# Patient Record
Sex: Male | Born: 1990 | Race: White | Hispanic: No | Marital: Single | Smoking: Current every day smoker
Health system: Southern US, Community
[De-identification: ages and names within clinical notes are randomized; demographics above are authoritative.]

## PROBLEM LIST (undated history)

## (undated) DIAGNOSIS — F3181 Bipolar II disorder: Secondary | ICD-10-CM

## (undated) DIAGNOSIS — F603 Borderline personality disorder: Secondary | ICD-10-CM

## (undated) DIAGNOSIS — T1491XA Suicide attempt, initial encounter: Secondary | ICD-10-CM

---
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
## 2009-10-22 ENCOUNTER — Ambulatory Visit: Payer: Self-pay | Admitting: Family Medicine

## 2012-06-01 ENCOUNTER — Ambulatory Visit: Payer: Self-pay | Admitting: Physician Assistant

## 2012-06-03 ENCOUNTER — Ambulatory Visit: Payer: Self-pay | Admitting: Family Medicine

## 2013-05-30 ENCOUNTER — Ambulatory Visit: Payer: Self-pay | Admitting: Physician Assistant

## 2013-06-22 ENCOUNTER — Ambulatory Visit: Payer: Self-pay | Admitting: Physician Assistant

## 2013-09-13 ENCOUNTER — Ambulatory Visit: Payer: Self-pay | Admitting: Family Medicine

## 2013-09-13 ENCOUNTER — Emergency Department: Payer: Self-pay | Admitting: Emergency Medicine

## 2013-09-13 LAB — URINALYSIS, COMPLETE
BACTERIA: NONE SEEN
BILIRUBIN, UR: NEGATIVE
Blood: NEGATIVE
GLUCOSE, UR: NEGATIVE mg/dL (ref 0–75)
Ketone: NEGATIVE
Leukocyte Esterase: NEGATIVE
Nitrite: NEGATIVE
Ph: 7 (ref 4.5–8.0)
Protein: NEGATIVE
RBC,UR: <1 /HPF (ref 0–5)
Specific Gravity: 1.013 (ref 1.003–1.030)
Squamous Epithelial: NONE SEEN

## 2013-09-13 LAB — BASIC METABOLIC PANEL
Anion Gap: 7 (ref 7–16)
BUN: 9 mg/dL (ref 7–18)
CO2: 26 mmol/L (ref 21–32)
Calcium, Total: 9.4 mg/dL (ref 8.5–10.1)
Chloride: 108 mmol/L — ABNORMAL HIGH (ref 98–107)
Creatinine: 0.79 mg/dL (ref 0.60–1.30)
EGFR (African American): >60
EGFR (Non-African Amer.): >60
GLUCOSE: 100 mg/dL — AB (ref 65–99)
Osmolality: 280 (ref 275–301)
POTASSIUM: 3.5 mmol/L (ref 3.5–5.1)
Sodium: 141 mmol/L (ref 136–145)

## 2013-09-13 LAB — DRUG SCREEN, URINE
Amphetamines, Ur Screen: NEGATIVE (ref ?–1000)
BENZODIAZEPINE, UR SCRN: NEGATIVE (ref ?–200)
Barbiturates, Ur Screen: NEGATIVE (ref ?–200)
CANNABINOID 50 NG, UR ~~LOC~~: POSITIVE (ref ?–50)
Cocaine Metabolite,Ur ~~LOC~~: NEGATIVE (ref ?–300)
MDMA (Ecstasy)Ur Screen: NEGATIVE (ref ?–500)
Methadone, Ur Screen: NEGATIVE (ref ?–300)
OPIATE, UR SCREEN: NEGATIVE (ref ?–300)
PHENCYCLIDINE (PCP) UR S: NEGATIVE (ref ?–25)
TRICYCLIC, UR SCREEN: NEGATIVE (ref ?–1000)

## 2013-09-13 LAB — CBC
HCT: 46.6 % (ref 40.0–52.0)
HGB: 15.7 g/dL (ref 13.0–18.0)
MCH: 30.4 pg (ref 26.0–34.0)
MCHC: 33.8 g/dL (ref 32.0–36.0)
MCV: 90 fL (ref 80–100)
Platelet: 255 10*3/uL (ref 150–440)
RBC: 5.18 10*6/uL (ref 4.40–5.90)
RDW: 12.9 % (ref 11.5–14.5)
WBC: 12.6 10*3/uL — AB (ref 3.8–10.6)

## 2013-09-13 LAB — ETHANOL: Ethanol %: <0.003 % (ref 0.000–0.080)

## 2013-10-17 ENCOUNTER — Ambulatory Visit: Payer: Self-pay | Admitting: Emergency Medicine

## 2014-07-28 ENCOUNTER — Ambulatory Visit: Payer: Self-pay | Admitting: Family Medicine

## 2014-12-17 ENCOUNTER — Ambulatory Visit
Admission: EM | Admit: 2014-12-17 | Discharge: 2014-12-17 | Disposition: A | Discharge status: Home / Self Care | Payer: 59 | Attending: Family Medicine | Admitting: Family Medicine

## 2014-12-17 ENCOUNTER — Encounter: Payer: Self-pay | Admitting: Emergency Medicine

## 2014-12-17 DIAGNOSIS — F3161 Bipolar disorder, current episode mixed, mild: Secondary | ICD-10-CM

## 2014-12-17 HISTORY — DX: Bipolar II disorder: F31.81

## 2014-12-17 HISTORY — DX: Suicide attempt, initial encounter: T14.91XA

## 2014-12-17 HISTORY — DX: Borderline personality disorder: F60.3

## 2014-12-17 MED ORDER — ALBUTEROL SULFATE HFA 108 (90 BASE) MCG/ACT IN AERS: 2.0000 | INHALATION_SPRAY | Freq: Four times a day (QID) | RESPIRATORY_TRACT | Status: DC | PRN

## 2014-12-17 MED ORDER — TRAZODONE HCL 150 MG PO TABS: 150.0000 mg | ORAL_TABLET | Freq: Every day | ORAL | Status: DC

## 2014-12-17 MED ORDER — CITALOPRAM HYDROBROMIDE 40 MG PO TABS: 40.0000 mg | ORAL_TABLET | Freq: Every day | ORAL | Status: AC

## 2014-12-17 MED ORDER — BUPROPION HCL ER (SR) 150 MG PO TB12: 150.0000 mg | ORAL_TABLET | Freq: Three times a day (TID) | ORAL | Status: AC

## 2014-12-17 MED ORDER — QUETIAPINE FUMARATE 100 MG PO TABS: 100.0000 mg | ORAL_TABLET | Freq: Three times a day (TID) | ORAL | Status: DC

## 2014-12-17 MED ORDER — GABAPENTIN 100 MG PO CAPS: 100.0000 mg | ORAL_CAPSULE | Freq: Three times a day (TID) | ORAL | Status: DC

## 2014-12-17 NOTE — ED Notes (Signed)
Patient states that he just got released from jail and has no medications to take.

## 2014-12-17 NOTE — ED Provider Notes (Signed)
CSN: [id number]     Arrival date & time 12/17/14  1542 History   First MD Initiated Contact with Patient 12/17/14 1639     Chief Complaint  Patient presents with  . Medication Refill   (Consider location/radiation/quality/duration/timing/severity/associated sxs/prior Treatment) HPI Comments: 24 yo male with a h/o bipolar disorder presents with mother stating he was just released from jail today after being there for 4 months, however he was given any prescriptions for his chronic medications. Patient has been on psychiatric medications for years and has a psychiatrist. Patient states that while in jail he was seen by a psychiatrist there who made some adjustments on his medications initially, but now has been stable and doing well on the current doses/meds. Patient requesting refill on his medications since he was not given any prescriptions prior to being released from jail today. He states he will call his outpatient psychiatrist on Monday to schedule an appointment ASAP.   The history is provided by the patient.    Past Medical History  Diagnosis Date  . Bipolar 2 disorder   . Borderline personality disorder   . Suicide attempt    History reviewed. No pertinent past surgical history. History reviewed. No pertinent family history. History  Substance Use Topics  . Smoking status: Current Every Day Smoker  . Smokeless tobacco: Never Used  . Alcohol Use: No    Review of Systems  Allergies  Sulfa antibiotics  Home Medications   Prior to Admission medications   Medication Sig Start Date End Date Taking? Authorizing Provider  omeprazole (PRILOSEC) 20 MG capsule Take 20 mg by mouth daily.   Yes Historical Provider, MD  albuterol (PROVENTIL HFA;VENTOLIN HFA) 108 (90 BASE) MCG/ACT inhaler Inhale 2 puffs into the lungs every 6 (six) hours as needed for wheezing or shortness of breath. 12/17/14   Payton Mccallum, MD  buPROPion (WELLBUTRIN SR) 150 MG 12 hr tablet Take 1 tablet (150 mg total)  by mouth 3 (three) times daily. 12/17/14   Payton Mccallum, MD  citalopram (CELEXA) 40 MG tablet Take 1 tablet (40 mg total) by mouth daily. 12/17/14   Payton Mccallum, MD  gabapentin (NEURONTIN) 100 MG capsule Take 1 capsule (100 mg total) by mouth 3 (three) times daily. 12/17/14   Payton Mccallum, MD  QUEtiapine (SEROQUEL) 100 MG tablet Take 1 tablet (100 mg total) by mouth 3 (three) times daily. 12/17/14   Payton Mccallum, MD  traZODone (DESYREL) 150 MG tablet Take 1 tablet (150 mg total) by mouth at bedtime. 12/17/14   Payton Mccallum, MD   BP 137/84 mmHg  Pulse 86  Temp(Src) 97.7 F (36.5 C) (Tympanic)  Resp 16  Ht  (1.626 m)  Wt 200 lb (90.719 kg)  BMI 34.31 kg/m2  SpO2 98% Physical Exam  Constitutional: He is oriented to person, place, and time. He appears well-developed and well-nourished. No distress.  Eyes: Conjunctivae are normal. Pupils are equal, round, and reactive to light.  Neck: Normal range of motion. Neck supple. No thyromegaly present.  Cardiovascular: Normal rate, regular rhythm and normal heart sounds.   Pulmonary/Chest: Effort normal and breath sounds normal. No respiratory distress. He has no wheezes. He has no rales.  Lymphadenopathy:    He has no cervical adenopathy.  Neurological: He is alert and oriented to person, place, and time.  Skin: He is not diaphoretic.  Psychiatric: He has a normal mood and affect. His behavior is normal. Thought content normal.  Nursing note and vitals reviewed.   ED  Course  Procedures (including critical care time) Labs Review Labs Reviewed - No data to display  Imaging Review No results found.   MDM   1. Bipolar disorder, current episode mixed, mild    Discharge Medication List as of 12/17/2014  5:05 PM    Plan: 1.  diagnosis reviewed with patient 2. rx as per orders; risks, benefits, potential side effects reviewed with patient; medications as per med list refilled for 10 days only; explained to patient, he needs to see his  psychiatrist ASAP for refills on these chronic medications 3.  F/u prn   Payton Mccallum, MD 12/17/14 2021

## 2015-02-08 ENCOUNTER — Ambulatory Visit: Payer: 59

## 2015-02-08 ENCOUNTER — Encounter: Payer: Self-pay | Admitting: Emergency Medicine

## 2015-02-08 ENCOUNTER — Ambulatory Visit
Admission: EM | Admit: 2015-02-08 | Discharge: 2015-02-08 | Disposition: A | Discharge status: Home / Self Care | Payer: 59 | Attending: Family Medicine | Admitting: Family Medicine

## 2015-02-08 DIAGNOSIS — S62309A Unspecified fracture of unspecified metacarpal bone, initial encounter for closed fracture: Secondary | ICD-10-CM

## 2015-02-08 MED ORDER — HYDROCODONE-ACETAMINOPHEN 5-325 MG PO TABS: 1.0000 | ORAL_TABLET | Freq: Three times a day (TID) | ORAL | Status: DC | PRN

## 2015-02-08 MED ORDER — KETOROLAC TROMETHAMINE 60 MG/2ML IM SOLN
60.0000 mg | Freq: Once | INTRAMUSCULAR | Status: AC
  Administered 2015-02-08: 60 mg via INTRAMUSCULAR

## 2015-02-08 NOTE — ED Notes (Signed)
[facility] called and appointment made for patient to see Dr. Deeann Saint on 02/10/15 at 1:30pm. Disc from Radiology given and instructions given-arrive at 1:15pm and take all insurance information with him. Verbally understands

## 2015-02-08 NOTE — ED Notes (Signed)
Pt with pain and swelling in right hand after a fall x 2 weeks

## 2015-02-08 NOTE — ED Provider Notes (Addendum)
CSN: [id number]     Arrival date & time 02/08/15  1528 History   First MD Initiated Contact with Patient 02/08/15 1544     Chief Complaint  Patient presents with  . Hand Injury   patient states that he fell about 2 weeks ago hit head and hip rock as he fell and he has pain and tenderness in the same area for the fifth metacarpal as if he had a boxer's fracture. He strongly denies hitting anything approaching.    (Consider location/radiation/quality/duration/timing/severity/associated sxs/prior Treatment) Patient is a 24 y.o. male presenting with hand injury. The history is provided by the patient. No language interpreter was used.  Hand Injury Location:  Hand Time since incident:   -2 weeks Injury: yes   Hand location:  R hand Pain details:    Quality:  Aching, sharp, throbbing and tingling   Radiates to:  Does not radiate   Severity:  Severe   Onset quality:  Gradual   Timing:  Constant Chronicity:  New Handedness:  Right-handed Foreign body present:  No foreign bodies Prior injury to area:  No Relieved by:  Nothing Worsened by:  Movement Ineffective treatments:  None tried Associated symptoms: decreased range of motion   Associated symptoms: no neck pain and no numbness   Risk factors: no known bone disorder and no frequent fractures     Past Medical History  Diagnosis Date  . Bipolar 2 disorder   . Borderline personality disorder   . Suicide attempt    History reviewed. No pertinent past surgical history. History reviewed. No pertinent family history. Social History  Substance Use Topics  . Smoking status: Current Every Day Smoker  . Smokeless tobacco: Never Used  . Alcohol Use: Yes    Review of Systems  Musculoskeletal: Negative for neck pain.  Psychiatric/Behavioral:       History of bipolar disease, schizophrenia mouth according to the patient, and history of suicidal ideation area    Allergies  Sulfa antibiotics and Haldol  Home Medications   Prior  to Admission medications   Medication Sig Start Date End Date Taking? Authorizing Provider  albuterol (PROVENTIL HFA;VENTOLIN HFA) 108 (90 BASE) MCG/ACT inhaler Inhale 2 puffs into the lungs every 6 (six) hours as needed for wheezing or shortness of breath. 12/17/14   Payton Mccallum, MD  buPROPion (WELLBUTRIN SR) 150 MG 12 hr tablet Take 1 tablet (150 mg total) by mouth 3 (three) times daily. 12/17/14   Payton Mccallum, MD  citalopram (CELEXA) 40 MG tablet Take 1 tablet (40 mg total) by mouth daily. 12/17/14   Payton Mccallum, MD  gabapentin (NEURONTIN) 100 MG capsule Take 1 capsule (100 mg total) by mouth 3 (three) times daily. 12/17/14   Payton Mccallum, MD  HYDROcodone-acetaminophen (NORCO) 5-325 MG tablet Take 1 tablet by mouth every 8 (eight) hours as needed for moderate pain. 02/08/15   Hassan Rowan, MD  omeprazole (PRILOSEC) 20 MG capsule Take 20 mg by mouth daily.    Historical Provider, MD  QUEtiapine (SEROQUEL) 100 MG tablet Take 1 tablet (100 mg total) by mouth 3 (three) times daily. 12/17/14   Payton Mccallum, MD  traZODone (DESYREL) 150 MG tablet Take 1 tablet (150 mg total) by mouth at bedtime. 12/17/14   Payton Mccallum, MD   Meds Ordered and Administered this Visit   Medications  ketorolac (TORADOL) injection 60 mg (60 mg Intramuscular Given 02/08/15 1648)    BP 134/76 mmHg  Pulse 78  Temp(Src) 97.8 F (36.6 C) (Tympanic)  Resp 18  Ht 5' 3.5" (1.613 m)  Wt 187 lb (84.823 kg)  BMI 32.60 kg/m2  SpO2 99% No data found.   Physical Exam  Constitutional: He is oriented to person, place, and time. He appears well-developed and well-nourished.  HENT:  Head: Normocephalic and atraumatic.  Eyes: Conjunctivae are normal. Pupils are equal, round, and reactive to light.  Musculoskeletal:       Right hand: He exhibits decreased range of motion, tenderness, bony tenderness and swelling.       Hands: Neurological: He is alert and oriented to person, place, and time.  Skin: Skin is warm and dry.    Psychiatric: His mood appears anxious. His speech is rapid and/or pressured.    ED Course  Procedures (including critical care time)  Labs Review Labs Reviewed - No data to display  Imaging Review No results found.   Visual Acuity Review  Right Eye Distance:   Left Eye Distance:   Bilateral Distance:    Right Eye Near:   Left Eye Near:    Bilateral Near:         MDM   1. Closed fracture of multiple sites of metacarpal of right hand, initial encounter   Fracture the fourth and fifth metacarpal carpal bone with impairment of movement of the fourth and fifth fingers. We'll place an splint and referred to orthopedic. He will be seen Thursday by [facility] orthopedic hand surgeon. Because the amount of pain he is having a prescription of Vicodin also given to him as well.  Before leaving patient was given an injection of Toradol as well. Hassan Rowan, MD 02/08/15 1704  Hassan Rowan, MD 03/25/15 [phone]

## 2015-02-08 NOTE — Discharge Instructions (Signed)
Cast or Splint Care °Casts and splints support injured limbs and keep bones from moving while they heal. It is important to care for your cast or splint at home.   °HOME CARE INSTRUCTIONS °· Keep the cast or splint uncovered during the drying period. It can take 24 to 48 hours to dry if it is made of plaster. A fiberglass cast will dry in less than 1 hour. °· Do not rest the cast on anything harder than a pillow for the first 24 hours. °· Do not put weight on your injured limb or apply pressure to the cast until your health care provider gives you permission. °· Keep the cast or splint dry. Wet casts or splints can lose their shape and may not support the limb as well. A wet cast that has lost its shape can also create harmful pressure on your skin when it dries. Also, wet skin can become infected. °¨ Cover the cast or splint with a plastic bag when bathing or when out in the rain or snow. If the cast is on the trunk of the body, take sponge baths until the cast is removed. °¨ If your cast does become wet, dry it with a towel or a blow dryer on the cool setting only. °· Keep your cast or splint clean. Soiled casts may be wiped with a moistened cloth. °· Do not place any hard or soft foreign objects under your cast or splint, such as cotton, toilet paper, lotion, or powder. °· Do not try to scratch the skin under the cast with any object. The object could get stuck inside the cast. Also, scratching could lead to an infection. If itching is a problem, use a blow dryer on a cool setting to relieve discomfort. °· Do not trim or cut your cast or remove padding from inside of it. °· Exercise all joints next to the injury that are not immobilized by the cast or splint. For example, if you have a long leg cast, exercise the hip joint and toes. If you have an arm cast or splint, exercise the shoulder, elbow, thumb, and fingers. °· Elevate your injured arm or leg on 1 or 2 pillows for the first 1 to 3 days to decrease  swelling and pain. It is best if you can comfortably elevate your cast so it is higher than your heart. °SEEK MEDICAL CARE IF:  °· Your cast or splint cracks. °· Your cast or splint is too tight or too loose. °· You have unbearable itching inside the cast. °· Your cast becomes wet or develops a soft spot or area. °· You have a bad smell coming from inside your cast. °· You get an object stuck under your cast. °· Your skin around the cast becomes red or raw. °· You have new pain or worsening pain after the cast has been applied. °SEEK IMMEDIATE MEDICAL CARE IF:  °· You have fluid leaking through the cast. °· You are unable to move your fingers or toes. °· You have discolored (blue or white), cool, painful, or very swollen fingers or toes beyond the cast. °· You have tingling or numbness around the injured area. °· You have severe pain or pressure under the cast. °· You have any difficulty with your breathing or have shortness of breath. °· You have chest pain. °Document Released: 04/27/2000 Document Revised: 02/18/2013 Document Reviewed: 11/06/2012 °[employer]® Patient Information ©2015 [employer]. This information is not intended to replace advice given to you by your health care   provider. Make sure you discuss any questions you have with your health care provider.   Metacarpal Fractures Fractures of metacarpals are breaks in the bones of the hand. They extend from the knuckles to the wrist. These bones can break in many ways. There are different ways of treating these fractures. HOME CARE  Only exercise as told by your doctor.  Return to activities as told by your doctor.  Go to physical therapy as told by your doctor.  Follow your doctor's advice about driving.  Keep the injured hand raised (elevated) above the level of your heart.  If a plaster, fiberglass, or pre-formed splint was applied:  Wear your splint as told and until you are examined again.  Apply ice on the injury for 15-20 minutes  at a time, 03-04 times a day. Put the ice in a plastic bag. Place a towel between your skin and the bag.  Do not get your splint or cast wet. Protect it during bathing with a plastic bag.  Loosen the elastic bandage around the splint if your fingers start to get numb, tingle, get cold, or turn blue.  If the splint is plaster, do not lean it on hard surfaces or put pressure on it for 24 hours after it is put on.  Do not  try to scratch the skin under the cast.  Check the skin around the cast every day. You may put lotion on red or sore areas.  Move the fingers of your casted hand several times a day.  Only take medicine as told by your doctor.  Follow up as told by your doctor. This is very important in order to avoid permanent injury, disability, or lasting (chronic) pain. GET HELP RIGHT AWAY IF:   You develop a rash.  You have problems breathing.  You have any allergy problems.  You have more than a small spot of blood from beneath your cast or splint.  You have redness, puffiness (swelling), or more pain from beneath your cast or splint.  Yellowish-white fluid (pus) comes from beneath your cast or splint.  You develop a temperature by mouth above 102 F (38.9 C), not controlled by medicine.  You have a bad smell coming from under your cast or splint.  You have problems moving any of your fingers. If you do not have a window in your cast for looking at the wound, a fluid or a little bleeding may show up as a stain on the outside of your cast. Tell your doctor about any stains you see. MAKE SURE YOU:   Understand these instructions.  Will watch your condition.  Will get help right away if you are not doing well or get worse. Document Released: 10/17/2007 Document Revised: 09/14/2013 Document Reviewed: 04/05/2009 [facility] Patient Information 2015 [place], [place]. This information is not intended to replace advice given to you by your health care provider. Make sure you  discuss any questions you have with your health care provider.

## 2015-06-06 ENCOUNTER — Ambulatory Visit
Admission: EM | Admit: 2015-06-06 | Discharge: 2015-06-06 | Disposition: A | Discharge status: Home / Self Care | Payer: 59 | Attending: Family Medicine | Admitting: Family Medicine

## 2015-06-06 DIAGNOSIS — L089 Local infection of the skin and subcutaneous tissue, unspecified: Secondary | ICD-10-CM

## 2015-06-06 MED ORDER — MUPIROCIN 2 % EX OINT: TOPICAL_OINTMENT | CUTANEOUS | Status: DC

## 2015-06-06 MED ORDER — AMOXICILLIN-POT CLAVULANATE 875-125 MG PO TABS: 1.0000 | ORAL_TABLET | Freq: Two times a day (BID) | ORAL | Status: DC
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
## 2015-06-06 NOTE — Discharge Instructions (Signed)
Keep clean. Avoid rubbing, scratching or aggravating. Take medication as prescribed.   Follow up closely with your primary care physician this week as needed. Return to Urgent care for new or worsening concerns.

## 2015-06-17 ENCOUNTER — Ambulatory Visit (INDEPENDENT_AMBULATORY_CARE_PROVIDER_SITE_OTHER): Payer: 59

## 2015-06-17 ENCOUNTER — Ambulatory Visit
Admission: EM | Admit: 2015-06-17 | Discharge: 2015-06-17 | Disposition: A | Discharge status: Home / Self Care | Payer: 59 | Attending: Family Medicine | Admitting: Family Medicine

## 2015-06-17 ENCOUNTER — Encounter: Payer: Self-pay | Admitting: *Deleted

## 2015-06-17 DIAGNOSIS — S62318A Displaced fracture of base of other metacarpal bone, initial encounter for closed fracture: Secondary | ICD-10-CM

## 2015-06-17 DIAGNOSIS — S62636A Displaced fracture of distal phalanx of right little finger, initial encounter for closed fracture: Secondary | ICD-10-CM | POA: Diagnosis not present

## 2015-06-17 MED ORDER — HYDROCODONE-ACETAMINOPHEN 5-325 MG PO TABS: 1.0000 | ORAL_TABLET | Freq: Three times a day (TID) | ORAL | Status: DC | PRN
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
## 2015-06-17 NOTE — ED Provider Notes (Signed)
[facility]  ____________________________________________  Time seen: Approximately 6:00 PM  I have reviewed the triage vital signs and the nursing notes.   HISTORY  Chief Complaint Hand Injury   HPI Darin Wood is a 25 y.o. male presents with a complaint of right hand pain. Patient reports that 2 hours prior to arrival he became frustrated and mad and states that he punched a wall out of frustration. Patient reports that right lateral hand pain since that injury. Denies other pain or injury. Denies head injury or loss of consciousness. Denies fall.  Patient states that current right hand pain is 8 out of 10 aching and throbbing. Denies numbness or tingling sensation. Reports he is able to move all fingers and hands well but with pain.   Past Medical History  Diagnosis Date  . Bipolar 2 disorder (HCC)   . Borderline personality disorder   . Suicide attempt (HCC)     There are no active problems to display for this patient.   History reviewed. No pertinent past surgical history.  Current Outpatient Rx  Name  Route  Sig  Dispense  Refill  . albuterol (PROVENTIL HFA;VENTOLIN HFA) 108 (90 BASE) MCG/ACT inhaler   Inhalation   Inhale 2 puffs into the lungs every 6 (six) hours as needed for wheezing or shortness of breath.   8 g   0   .           . clonazePAM (KLONOPIN) 1 MG tablet   Oral   Take 1 mg by mouth 2 (two) times daily.         . divalproex (DEPAKOTE) 500 MG DR tablet   Oral   Take 500 mg by mouth 2 (two) times daily.         Marland Kitchen gabapentin (NEURONTIN) 100 MG capsule   Oral   Take 1 capsule (100 mg total) by mouth 3 (three) times daily.   30 capsule   0   .                      . buPROPion (WELLBUTRIN SR) 150 MG 12 hr tablet   Oral   Take 1 tablet (150 mg total) by mouth 3 (three) times daily.   30 tablet   0   . citalopram (CELEXA) 40 MG tablet   Oral   Take 1 tablet (40 mg total) by mouth daily.   10 tablet   0   .           .            .             Allergies Ceclor; Sulfa antibiotics; Toradol; and Haldol  Family History  Problem Relation Age of Onset  . Diabetes Mother   . Pancreatitis Father     Social History Social History  Substance Use Topics  . Smoking status: Current Every Day Smoker -- 1.00 packs/day    Types: Cigarettes  . Smokeless tobacco: Never Used  . Alcohol Use: Yes     Comment: regularly    Review of Systems Constitutional: No fever/chills Eyes: No visual changes. ENT: No sore throat. Cardiovascular: Denies chest pain. Respiratory: Denies shortness of breath. Gastrointestinal: No abdominal pain.  No nausea, no vomiting.  No diarrhea.  No constipation. Genitourinary: Negative for dysuria. Musculoskeletal: Negative for back pain. Right hand pain.  Skin: Negative for rash. Neurological: Negative for headaches, focal weakness or numbness.  10-point ROS otherwise  negative.  ____________________________________________   PHYSICAL EXAM:  VITAL SIGNS: ED Triage Vitals  Enc Vitals Group     BP 06/17/15 1703 137/78 mmHg     Pulse Rate 06/17/15 1703 81     Resp 06/17/15 1703 18     Temp 06/17/15 1703 98 F (36.7 C)     Temp Source 06/17/15 1703 Oral     SpO2 06/17/15 1703 98 %     Weight 06/17/15 1703 157 lb (71.215 kg)     Height 06/17/15 1703 5\' 4"  (1.626 m)     Head Cir --      Peak Flow --      Pain Score 06/17/15 1708 10     Pain Loc --      Pain Edu? --      Excl. in GC? --     Constitutional: Alert and oriented. Well appearing and in no acute distress. Eyes: Conjunctivae are normal. PERRL. EOMI. Head: Atraumatic.  Nose: No congestion/rhinnorhea.  Mouth/Throat: Mucous membranes are moist.  Cardiovascular: Normal rate, regular rhythm. Grossly normal heart sounds.  Good peripheral circulation. Respiratory: Normal respiratory effort.  No retractions. Lungs CTAB. Gastrointestinal: Soft and nontender. Musculoskeletal: No lower or upper extremity tenderness nor  edema.  No cervical, thoracic or lumbar tenderness to palpation. Except: Right dorsal hand over fourth and fifth distal metacarpals moderate tenderness to palpation with moderate swelling, mild ecchymosis, no erythema, skin intact; Cap refill less than 2 seconds to all distal right hand fingers, pain with right hand grip at distal fifth metacarpal, pain with right hand resisted fourth and fifth digit flexion and extension with full range of motion present and tendon function intact. No motor, tendon or sensation deficit. Right upper extremity otherwise nontender. Neurologic:  Normal speech and language. No gross focal neurologic deficits are appreciated. No gait instability. Skin:  Skin is warm, dry and intact. No rash noted. Psychiatric: Mood and affect are normal. Speech and behavior are normal.  ____________________________________________   LABS (all labs ordered are listed, but only abnormal results are displayed)  Labs Reviewed - No data to display  RADIOLOGY  EXAM: RIGHT HAND - COMPLETE 3+ VIEW  COMPARISON: February 08, 2015.  FINDINGS: There appears to be mildly displaced acute fracture superimposed upon deformity of distal fifth metacarpal from previous old fracture. Overlying soft tissue swelling is noted. No radiopaque foreign body is noted. No other bony abnormality is noted.  IMPRESSION: Mildly displaced acute fracture involving distal fifth metacarpal in the area of deformity from previous old healed fracture.   Electronically Signed By: Lupita Raider, M.D. On: 06/17/2015 17:35  I, Renford Dills, personally viewed and evaluated these images (plain radiographs) as part of my medical decision making, as well as reviewing the written report by the radiologist.  ____________________________________________   PROCEDURES  Procedure(s) performed:  Right ocl ulnar gutter splint applied by RN. Patient states has sling at home. Neurovascular intact post  application.  ____________________________________________   INITIAL IMPRESSION / ASSESSMENT AND PLAN / ED COURSE  Pertinent labs & imaging results that were available during my care of the patient were reviewed by me and considered in my medical decision making (see chart for details).  Very well-appearing patient. No acute distress. Presents for the complaint of right lateral hand pain post injury. Reports that he became mad earlier and punched a wall. Reports he is right-hand dominant. Denies other injury. Patient reports history of similar this past year. Right hand x-ray reviewed, mildly displaced acute fracture involving  distal fifth metacarpal in the area of the deformity from previous old health fracture per radiologist. Patient placed in right OCL ulnar gutter splint. Patient directed apply ice and elevate. Directed to follow-up with orthopedist.. over-the-counter Tylenol or ibuprofen as needed, quantity #10 Norco given.  Discussed follow up with Primary care physician this week. Discussed follow up and return parameters including no resolution or any worsening concerns. Patient verbalized understanding and agreed to plan.   ____________________________________________   FINAL CLINICAL IMPRESSION(S) / ED DIAGNOSES  Final diagnoses:  Fracture of fifth metacarpal bone, closed, initial encounter      Note: This dictation was prepared with Dragon dictation along with smaller phrase technology. Any transcriptional errors that result from this process are unintentional.    Renford Dills, NP 06/17/15 2135

## 2015-06-17 NOTE — Discharge Instructions (Signed)
Keep in splint. Elevate and apply ice.  Take medication as prescribed. Rest. Drink plenty of fluids.   Follow up with your primary care physician this week as needed. Return to Urgent care for new or worsening concerns.    Metacarpal Fracture A metacarpal fracture is a break (fracture) of a bone in the hand. Metacarpals are the bones that extend from your knuckles to your wrist. In each hand, you have five metacarpal bones that connect your fingers and your thumb to your wrist. Some hand fractures have bone pieces that are close together and stable (simple). These fractures may be treated with only a splint or cast. Hand fractures that have many pieces of broken bone (comminuted), unstable bone pieces (displaced), or a bone that breaks through the skin (compound) usually require surgery. CAUSES This injury may be caused by:  A fall.  A hard, direct hit to your hand.  An injury that squeezes your knuckle, stretches your finger out of place, or crushes your hand. RISK FACTORS This injury is more likely to occur if:  You play contact sports.  You have certain bone diseases. SYMPTOMS  Symptoms of this type of fracture develop soon after the injury. Symptoms may include:  Swelling.  Pain.  Stiffness.  Increased pain with movement.  Bruising.  Inability to move a finger.  A shortened finger.  A finger knuckle that looks sunken in.  Unusual appearance of the hand or finger (deformity). DIAGNOSIS  This injury may be diagnosed based on your signs and symptoms, especially if you had a recent hand injury. Your health care provider will perform a physical exam. He or she may also order X-rays to confirm the diagnosis.  TREATMENT  Treatment for this injury depends on the type of fracture you have and how severe it is. Possible treatments include:  Non-reduction. This can be done if the bone does not need to be moved back into place. The fracture can be casted or splinted as it is.    Closed reduction. If your bone is stable and can be moved back into place, you may only need to wear a cast or splint or have buddy taping.  Closed reduction with internal fixation (CRIF). This is the most common treatment. You may have this procedure if your bone can be moved back into place but needs more support. Wires, pins, or screws may be inserted through your skin to stabilize the fracture.  Open reduction with internal fixation (ORIF). This may be needed if your fracture is severe and unstable. It involves surgery to move your bone back into the right position. Screws, wires, or plates are used to stabilize the fracture. After all procedures, you may need to wear a cast or a splint for several weeks. You will also need to have follow-up X-rays to make sure that the bone is healing well and staying in position. After you no longer need your cast or splint, you may need physical therapy. This will help you to regain full movement and strength in your hand.  HOME CARE INSTRUCTIONS  If You Have a Cast:  Do not stick anything inside the cast to scratch your skin. Doing that increases your risk of infection.  Check the skin around the cast every day. Report any concerns to your health care provider. You may put lotion on dry skin around the edges of the cast. Do not apply lotion to the skin underneath the cast. If You Have a Splint:  Wear it as directed  by your health care provider. Remove it only as directed by your health care provider.  Loosen the splint if your fingers become numb and tingle, or if they turn cold and blue. Bathing  Cover the cast or splint with a watertight plastic bag to protect it from water while you take a bath or a shower. Do not let the cast or splint get wet. Managing Pain, Stiffness, and Swelling  If directed, apply ice to the injured area (if you have a splint, not a cast):  Put ice in a plastic bag.  Place a towel between your skin and the  bag.  Leave the ice on for 20 minutes, 2-3 times a day.  Move your fingers often to avoid stiffness and to lessen swelling.  Raise the injured area above the level of your heart while you are sitting or lying down. Driving  Do not drive or operate heavy machinery while taking pain medicine.  Do not drive while wearing a cast or splint on a hand that you use for driving. Activity  Return to your normal activities as directed by your health care provider. Ask your health care provider what activities are safe for you. General Instructions  Do not put pressure on any part of the cast or splint until it is fully hardened. This may take several hours.  Keep the cast or splint clean and dry.  Do not use any tobacco products, including cigarettes, chewing tobacco, or electronic cigarettes. Tobacco can delay bone healing. If you need help quitting, ask your health care provider.  Take medicines only as directed by your health care provider.  Keep all follow-up visits as directed by your health care provider. This is important. SEEK MEDICAL CARE IF:   Your pain is getting worse.  You have redness, swelling, or pain in the injured area.   You have fluid, blood, or pus coming from under your cast or splint.   You notice a bad smell coming from under your cast or splint.   You have a fever.  SEEK IMMEDIATE MEDICAL CARE IF:   You develop a rash.   You have trouble breathing.   Your skin or nails on your injured hand turn blue or gray even after you loosen your splint.  Your injured hand feels cold or becomes numb even after you loosen your splint.   You develop severe pain under the cast or in your hand.   This information is not intended to replace advice given to you by your health care provider. Make sure you discuss any questions you have with your health care provider.   Document Released: 04/30/2005 Document Revised: 01/19/2015 Document Reviewed:  02/17/2014 Elsevier Interactive Patient Education [employer]

## 2015-06-21 ENCOUNTER — Ambulatory Visit: Payer: 59

## 2015-06-21 ENCOUNTER — Ambulatory Visit
Admission: EM | Admit: 2015-06-21 | Discharge: 2015-06-21 | Disposition: A | Discharge status: Home / Self Care | Payer: 59 | Attending: Family Medicine | Admitting: Family Medicine

## 2015-06-21 DIAGNOSIS — S0990XA Unspecified injury of head, initial encounter: Secondary | ICD-10-CM

## 2015-06-21 DIAGNOSIS — M542 Cervicalgia: Secondary | ICD-10-CM

## 2015-06-21 DIAGNOSIS — M25519 Pain in unspecified shoulder: Secondary | ICD-10-CM | POA: Diagnosis not present

## 2015-06-21 NOTE — ED Notes (Signed)
Attempted to document triage assessment and chart instructed this nurse to "break the glass". Chart opened and documentation done
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
## 2015-06-23 ENCOUNTER — Encounter: Payer: Self-pay | Admitting: *Deleted

## 2015-06-23 ENCOUNTER — Emergency Department
Admission: EM | Admit: 2015-06-23 | Discharge: 2015-06-23 | Disposition: A | Discharge status: Home / Self Care | Payer: 59 | Attending: Emergency Medicine | Admitting: Emergency Medicine

## 2015-06-23 DIAGNOSIS — Y92410 Unspecified street and highway as the place of occurrence of the external cause: Secondary | ICD-10-CM | POA: Insufficient documentation

## 2015-06-23 DIAGNOSIS — S161XXA Strain of muscle, fascia and tendon at neck level, initial encounter: Secondary | ICD-10-CM | POA: Insufficient documentation

## 2015-06-23 DIAGNOSIS — Y9389 Activity, other specified: Secondary | ICD-10-CM | POA: Insufficient documentation

## 2015-06-23 DIAGNOSIS — S199XXA Unspecified injury of neck, initial encounter: Secondary | ICD-10-CM | POA: Diagnosis present

## 2015-06-23 DIAGNOSIS — S39012A Strain of muscle, fascia and tendon of lower back, initial encounter: Secondary | ICD-10-CM | POA: Insufficient documentation

## 2015-06-23 DIAGNOSIS — F17210 Nicotine dependence, cigarettes, uncomplicated: Secondary | ICD-10-CM | POA: Diagnosis not present

## 2015-06-23 DIAGNOSIS — Y998 Other external cause status: Secondary | ICD-10-CM | POA: Insufficient documentation

## 2015-06-23 DIAGNOSIS — Z792 Long term (current) use of antibiotics: Secondary | ICD-10-CM | POA: Diagnosis not present

## 2015-06-23 DIAGNOSIS — Z79899 Other long term (current) drug therapy: Secondary | ICD-10-CM | POA: Diagnosis not present

## 2015-06-23 DIAGNOSIS — F3181 Bipolar II disorder: Secondary | ICD-10-CM | POA: Insufficient documentation

## 2015-06-23 MED ORDER — METHOCARBAMOL 500 MG PO TABS
1000.0000 mg | ORAL_TABLET | Freq: Once | ORAL | Status: AC
  Administered 2015-06-23: 1000 mg via ORAL
  Filled 2015-06-23: qty 2

## 2015-06-23 MED ORDER — METHOCARBAMOL 750 MG PO TABS: 1500.0000 mg | ORAL_TABLET | Freq: Four times a day (QID) | ORAL | Status: DC

## 2015-06-23 MED ORDER — OXYCODONE-ACETAMINOPHEN 7.5-325 MG PO TABS: 1.0000 | ORAL_TABLET | ORAL | Status: DC | PRN

## 2015-06-23 MED ORDER — OXYCODONE-ACETAMINOPHEN 5-325 MG PO TABS
1.0000 | ORAL_TABLET | Freq: Once | ORAL | Status: AC
  Administered 2015-06-23: 1 via ORAL
  Filled 2015-06-23: qty 1

## 2015-06-23 MED ORDER — IBUPROFEN 600 MG PO TABS: 600.0000 mg | ORAL_TABLET | Freq: Four times a day (QID) | ORAL | Status: DC | PRN

## 2015-06-23 MED ORDER — IBUPROFEN 800 MG PO TABS
800.0000 mg | ORAL_TABLET | Freq: Once | ORAL | Status: AC
  Administered 2015-06-23: 800 mg via ORAL
  Filled 2015-06-23: qty 1
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
## 2015-06-23 NOTE — Discharge Instructions (Signed)
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
## 2015-09-26 ENCOUNTER — Other Ambulatory Visit: Payer: Self-pay | Admitting: *Deleted

## 2015-09-26 NOTE — Patient Outreach (Signed)
Unsuccessful first attempt to reach member to screen for care management needs related to high cost referral for numerous ED visits for various complaints. Unable to leave message on contact number due to voice mail box being full. Will attempt another outreach at a later date. Bary RichardJanet S. Tyriana Helmkamp RN,CCM,CDE [facility] Care Management Coordinator [facility] Office Phone [phone] Office Fax [phone]

## 2015-10-12 ENCOUNTER — Other Ambulatory Visit: Payer: Self-pay | Admitting: *Deleted

## 2015-10-12 ENCOUNTER — Encounter: Payer: Self-pay | Admitting: *Deleted

## 2015-10-12 NOTE — Patient Outreach (Signed)
Unsuccessful second attempt to reach member to screen for care management needs related to high cost referral for numerous ED visits for various complaints. Recording on contact number states number has been disconnected or no longer in service so will mail unsuccessful outreach letter to member and include [facility]. Bary RichardJanet S. Eliab Closson RN,CCM,CDE [facility] Care Management Coordinator [facility] Office Phone [phone] Office Fax [phone]

## 2016-01-05 DIAGNOSIS — F3177 Bipolar disorder, in partial remission, most recent episode mixed: Secondary | ICD-10-CM | POA: Diagnosis not present

## 2016-01-05 DIAGNOSIS — F410 Panic disorder [episodic paroxysmal anxiety] without agoraphobia: Secondary | ICD-10-CM | POA: Diagnosis not present

## 2016-01-05 DIAGNOSIS — F1120 Opioid dependence, uncomplicated: Secondary | ICD-10-CM | POA: Diagnosis not present

## 2016-01-05 DIAGNOSIS — Z79899 Other long term (current) drug therapy: Secondary | ICD-10-CM | POA: Diagnosis not present

## 2016-01-06 ENCOUNTER — Other Ambulatory Visit
Admission: RE | Admit: 2016-01-06 | Discharge: 2016-01-06 | Disposition: A | Discharge status: Home / Self Care | Payer: 59 | Source: Ambulatory Visit | Attending: Psychiatry | Admitting: Psychiatry

## 2016-01-06 DIAGNOSIS — F319 Bipolar disorder, unspecified: Secondary | ICD-10-CM | POA: Insufficient documentation

## 2016-01-06 DIAGNOSIS — Z79899 Other long term (current) drug therapy: Secondary | ICD-10-CM | POA: Diagnosis not present

## 2016-01-06 LAB — HEPATIC FUNCTION PANEL
ALT: 37 U/L (ref 17–63)
AST: 27 U/L (ref 15–41)
Albumin: 5 g/dL (ref 3.5–5.0)
Alkaline Phosphatase: 95 U/L (ref 38–126)
Total Bilirubin: 0.7 mg/dL (ref 0.3–1.2)
Total Protein: 7.5 g/dL (ref 6.5–8.1)

## 2016-01-06 LAB — VALPROIC ACID LEVEL

## 2016-01-10 ENCOUNTER — Emergency Department
Admission: EM | Admit: 2016-01-10 | Discharge: 2016-01-10 | Disposition: A | Discharge status: Home / Self Care | Payer: 59 | Attending: Emergency Medicine | Admitting: Emergency Medicine

## 2016-01-10 ENCOUNTER — Encounter: Payer: Self-pay | Admitting: *Deleted

## 2016-01-10 ENCOUNTER — Encounter: Payer: Self-pay | Admitting: Physician Assistant

## 2016-01-10 ENCOUNTER — Ambulatory Visit (INDEPENDENT_AMBULATORY_CARE_PROVIDER_SITE_OTHER)
Admission: EM | Admit: 2016-01-10 | Discharge: 2016-01-10 | Disposition: A | Discharge status: Home / Self Care | Payer: 59 | Source: Home / Self Care | Attending: Family Medicine | Admitting: Family Medicine

## 2016-01-10 DIAGNOSIS — R339 Retention of urine, unspecified: Secondary | ICD-10-CM | POA: Diagnosis not present

## 2016-01-10 DIAGNOSIS — F1123 Opioid dependence with withdrawal: Secondary | ICD-10-CM | POA: Diagnosis not present

## 2016-01-10 DIAGNOSIS — F1193 Opioid use, unspecified with withdrawal: Secondary | ICD-10-CM

## 2016-01-10 DIAGNOSIS — Z79899 Other long term (current) drug therapy: Secondary | ICD-10-CM | POA: Insufficient documentation

## 2016-01-10 DIAGNOSIS — R338 Other retention of urine: Secondary | ICD-10-CM | POA: Diagnosis not present

## 2016-01-10 DIAGNOSIS — F17210 Nicotine dependence, cigarettes, uncomplicated: Secondary | ICD-10-CM | POA: Diagnosis not present

## 2016-01-10 DIAGNOSIS — Z792 Long term (current) use of antibiotics: Secondary | ICD-10-CM | POA: Diagnosis not present

## 2016-01-10 LAB — URINALYSIS COMPLETE WITH MICROSCOPIC (ARMC ONLY)
BACTERIA UA: NONE SEEN
Bilirubin Urine: NEGATIVE
Glucose, UA: NEGATIVE mg/dL
Hgb urine dipstick: NEGATIVE
Ketones, ur: NEGATIVE mg/dL
Leukocytes, UA: NEGATIVE
Nitrite: NEGATIVE
PROTEIN: NEGATIVE mg/dL
SQUAMOUS EPITHELIAL / LPF: NONE SEEN
Specific Gravity, Urine: 1.016 (ref 1.005–1.030)
WBC, UA: NONE SEEN WBC/hpf (ref 0–5)
pH: 6 (ref 5.0–8.0)

## 2016-01-10 MED ORDER — TAMSULOSIN HCL 0.4 MG PO CAPS
0.4000 mg | ORAL_CAPSULE | Freq: Once | ORAL | Status: AC
  Administered 2016-01-10: 0.4 mg via ORAL
  Filled 2016-01-10: qty 1

## 2016-01-10 MED ORDER — ACETAMINOPHEN 325 MG PO TABS
650.0000 mg | ORAL_TABLET | Freq: Once | ORAL | Status: AC
  Administered 2016-01-10: 650 mg via ORAL
  Filled 2016-01-10: qty 2

## 2016-01-10 MED ORDER — TAMSULOSIN HCL 0.4 MG PO CAPS: 0.4000 mg | ORAL_CAPSULE | Freq: Every day | ORAL | 0 refills | Status: AC

## 2016-01-10 NOTE — ED Notes (Signed)
I&O output= 

## 2016-01-10 NOTE — Discharge Instructions (Signed)
Take the prescription med as directed. Follow-up with your psychiatrist for further opioid management. Return to the ED for any continued urinary retention.

## 2016-01-10 NOTE — ED Notes (Signed)
Pt denies any psych complaints at this time denies any SI or HI.

## 2016-01-10 NOTE — ED Notes (Signed)
Report given to [facility] ED charge nurse Nedra HaiLee, RN.

## 2016-01-10 NOTE — ED Notes (Signed)
Bladder scanner shows 

## 2016-01-10 NOTE — ED Provider Notes (Signed)
[facility]    CSN: [id number] Arrival date & time: 01/10/16  0102  First Provider Contact:  First MD Initiated Contact with Patient 01/10/16 1915        History   Chief Complaint Chief Complaint  Patient presents with  . Urinary Retention    HPI Darin Wood is a 25 y.o. male.   Patient is a 25 year old white male been on street drugs. He states his been on Suboxone on the street and he states he finally came clean to his psychiatrist who started him on Suboxone. Even though he states he has bought Suboxone on street he states didn't know the people by on street would be that ear that his friend at the get the prescription filled on the 24th of August. solel it from him yesterday. He now feels that he is going through withdrawals from this do not have an Suboxone. Also he states he is having urinary retention and he is unable to void and states that he has  been unable to void now for about 24 hours. He is curious whether this is a side effect of the Suboxone. He's having abdominal tenderness course because he has not been able to urinate in 24 hours. Should be noted that he was also smoking when initially came to the room. He has a history of bipolar disease borderline personality disorder and previous suicide attempts. No previous surgical history. Mother has diabetes and father has history of pancreatitis and of course he smokes.   The history is provided by the patient and a significant other. No language interpreter was used.    Past Medical History:  Diagnosis Date  . Bipolar 2 disorder (HCC)   . Borderline personality disorder   . Suicide attempt (HCC)     There are no active problems to display for this patient.   History reviewed. No pertinent surgical history.     Home Medications    Prior to Admission medications   Medication Sig Start Date End Date Taking? Authorizing Provider  buPROPion (WELLBUTRIN SR) 150 MG 12 hr tablet Take 1 tablet (150 mg  total) by mouth 3 (three) times daily. 12/17/14  Yes Payton Mccallum, MD  clonazePAM (KLONOPIN) 1 MG tablet Take 1 mg by mouth 2 (two) times daily.   Yes Historical Provider, MD  omeprazole (PRILOSEC) 20 MG capsule Take 20 mg by mouth daily.   Yes Historical Provider, MD  albuterol (PROVENTIL HFA;VENTOLIN HFA) 108 (90 BASE) MCG/ACT inhaler Inhale 2 puffs into the lungs every 6 (six) hours as needed for wheezing or shortness of breath. 12/17/14   Payton Mccallum, MD  amoxicillin-clavulanate (AUGMENTIN) 875-125 MG tablet Take 1 tablet by mouth every 12 (twelve) hours. 06/06/15   Renford Dills, NP  citalopram (CELEXA) 40 MG tablet Take 1 tablet (40 mg total) by mouth daily. 12/17/14   Payton Mccallum, MD  divalproex (DEPAKOTE) 500 MG DR tablet Take 500 mg by mouth 2 (two) times daily.    Historical Provider, MD  gabapentin (NEURONTIN) 100 MG capsule Take 1 capsule (100 mg total) by mouth 3 (three) times daily. 12/17/14   Payton Mccallum, MD  HYDROcodone-acetaminophen (NORCO/VICODIN) 5-325 MG tablet Take 1 tablet by mouth every 8 (eight) hours as needed for moderate pain or severe pain (do not drive or operate machinery while taking as can cause drowsiness). 06/17/15   Renford Dills, NP  ibuprofen (ADVIL,MOTRIN) 600 MG tablet Take 1 tablet (600 mg total) by mouth every 6 (six) hours as needed.  06/23/15   Joni Reiningonald K Smith, PA-C  methocarbamol (ROBAXIN-750) 750 MG tablet Take 2 tablets (1,500 mg total) by mouth 4 (four) times daily. 06/23/15   Joni Reiningonald K Smith, PA-C  mupirocin ointment (BACTROBAN) 2 % Apply three times a day for 5 days. 06/06/15   Renford DillsLindsey Miller, NP  oxyCODONE-acetaminophen (PERCOCET) 7.5-325 MG tablet Take 1 tablet by mouth every 4 (four) hours as needed for severe pain. 06/23/15   Joni Reiningonald K Smith, PA-C  QUEtiapine (SEROQUEL) 100 MG tablet Take 1 tablet (100 mg total) by mouth 3 (three) times daily. 12/17/14   Payton Mccallumrlando Conty, MD  traZODone (DESYREL) 150 MG tablet Take 1 tablet (150 mg total) by mouth at bedtime. 12/17/14    Payton Mccallumrlando Conty, MD    Family History Family History  Problem Relation Age of Onset  . Diabetes Mother   . Pancreatitis Father     Social History Social History  Substance Use Topics  . Smoking status: Current Every Day Smoker    Packs/day: 1.00    Types: Cigarettes  . Smokeless tobacco: Never Used  . Alcohol use Yes     Comment: regularly     Allergies   Ceclor [cefaclor]; Sulfa antibiotics; Toradol [ketorolac tromethamine]; Tramadol; and Haldol [haloperidol lactate]   Review of Systems Review of Systems  Constitutional: Negative.   Gastrointestinal: Positive for abdominal distention.  Genitourinary: Positive for decreased urine volume, difficulty urinating and flank pain.  All other systems reviewed and are negative.    Physical Exam Triage Vital Signs ED Triage Vitals  Enc Vitals Group     BP 01/10/16 1859 123/70     Pulse Rate 01/10/16 1859 97     Resp 01/10/16 1859 16     Temp 01/10/16 1859 98.1 F (36.7 C)     Temp Source 01/10/16 1859 Oral     SpO2 01/10/16 1859 94 %     Weight 01/10/16 1902 170 lb (77.1 kg)     Height 01/10/16 1902 5\' 4"  (1.626 m)     Head Circumference --      Peak Flow --      Pain Score 01/10/16 1906 7     Pain Loc --      Pain Edu? --      Excl. in GC? --    No data found.   Updated Vital Signs BP 123/70 (BP Location: Left Arm)   Pulse 97   Temp 98.1 F (36.7 C) (Oral)   Resp 16   Ht 5\' 4"  (1.626 m)   Wt 170 lb (77.1 kg)   SpO2 94%   BMI 29.18 kg/m   Visual Acuity Right Eye Distance:   Left Eye Distance:   Bilateral Distance:    Right Eye Near:   Left Eye Near:    Bilateral Near:     Physical Exam  Constitutional: He is oriented to person, place, and time. He appears well-developed and well-nourished.  HENT:  Head: Normocephalic and atraumatic.  Eyes: Pupils are equal, round, and reactive to light.  Neck: Normal range of motion. Neck supple.  Cardiovascular: Normal rate and regular rhythm.     Pulmonary/Chest: Effort normal. He has rhonchi.  Abdominal: Normal appearance and bowel sounds are normal. There is hepatosplenomegaly. There is tenderness in the suprapubic area. There is no CVA tenderness.    Musculoskeletal: Normal range of motion.  Lymphadenopathy:    He has no cervical adenopathy.  Neurological: He is alert and oriented to person, place, and time.  Skin: Skin  is warm and dry.  Psychiatric: He has a normal mood and affect.  Vitals reviewed.    UC Treatments / Results  Labs (all labs ordered are listed, but only abnormal results are displayed) Labs Reviewed - No data to display  EKG  EKG Interpretation None       Radiology No results found.  Procedures Procedures (including critical care time)  Medications Ordered in UC Medications - No data to display   Initial Impression / Assessment and Plan / UC Course  I have reviewed the triage vital signs and the nursing notes.  Pertinent labs & imaging results that were available during my care of the patient were reviewed by me and considered in my medical decision making (see chart for details).  Clinical Course    Patient questions whether this is comes from his Suboxone withdrawal explained to him that it does not matter Dictation #1 [id number]  [id number] for [place] because he will need to be evaluated and have an  in and out or Foley catheter placed, blood work and possible imaging studies. Irregardless this is a problem that needs to be handled in the ED and not urgent care. He wants to go to [facility] which is fine and our nurse Selena Batten will give report to charge nurse at [facility] ED.  Should be noted his significant other's repeatedly during the visit repeatedly tells him that she told him so  Final Clinical Impressions(s) / UC Diagnoses   Final diagnoses:  Acute urinary retention  Opioid withdrawal [facility])    New Prescriptions Discharge  Medication List as of 01/10/2016  7:48 PM       Note: This dictation was prepared with Dragon dictation along with smaller phrase technology. Any transcriptional errors that result from this process are unintentional.   Hassan Rowan, MD 01/10/16 2002
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
## 2016-01-10 NOTE — ED Triage Notes (Signed)
Pt unable to void in past 24 hours. Relates difficulty voiding with abruptly stopping Suboxone.

## 2016-01-10 NOTE — ED Triage Notes (Signed)
Pt has not voided in over 24 hrs no hx of the same, co abd pain. [facility]

## 2016-01-19 DIAGNOSIS — F3177 Bipolar disorder, in partial remission, most recent episode mixed: Secondary | ICD-10-CM | POA: Diagnosis not present

## 2016-01-19 DIAGNOSIS — F410 Panic disorder [episodic paroxysmal anxiety] without agoraphobia: Secondary | ICD-10-CM | POA: Diagnosis not present

## 2016-01-19 DIAGNOSIS — Z79899 Other long term (current) drug therapy: Secondary | ICD-10-CM | POA: Diagnosis not present

## 2016-01-19 DIAGNOSIS — F1120 Opioid dependence, uncomplicated: Secondary | ICD-10-CM | POA: Diagnosis not present

## 2016-02-09 DIAGNOSIS — F1120 Opioid dependence, uncomplicated: Secondary | ICD-10-CM | POA: Diagnosis not present

## 2016-02-09 DIAGNOSIS — F3177 Bipolar disorder, in partial remission, most recent episode mixed: Secondary | ICD-10-CM | POA: Diagnosis not present

## 2016-02-09 DIAGNOSIS — F410 Panic disorder [episodic paroxysmal anxiety] without agoraphobia: Secondary | ICD-10-CM | POA: Diagnosis not present

## 2016-02-09 DIAGNOSIS — Z79899 Other long term (current) drug therapy: Secondary | ICD-10-CM | POA: Diagnosis not present

## 2016-03-08 DIAGNOSIS — Z79899 Other long term (current) drug therapy: Secondary | ICD-10-CM | POA: Diagnosis not present

## 2016-03-08 DIAGNOSIS — F1120 Opioid dependence, uncomplicated: Secondary | ICD-10-CM | POA: Diagnosis not present

## 2016-03-08 DIAGNOSIS — F3177 Bipolar disorder, in partial remission, most recent episode mixed: Secondary | ICD-10-CM | POA: Diagnosis not present

## 2016-03-08 DIAGNOSIS — F410 Panic disorder [episodic paroxysmal anxiety] without agoraphobia: Secondary | ICD-10-CM | POA: Diagnosis not present

## 2016-03-08 DIAGNOSIS — F29 Unspecified psychosis not due to a substance or known physiological condition: Secondary | ICD-10-CM | POA: Diagnosis not present

## 2016-04-04 DIAGNOSIS — F1120 Opioid dependence, uncomplicated: Secondary | ICD-10-CM | POA: Diagnosis not present

## 2016-04-04 DIAGNOSIS — Z79899 Other long term (current) drug therapy: Secondary | ICD-10-CM | POA: Diagnosis not present

## 2016-04-04 DIAGNOSIS — F3177 Bipolar disorder, in partial remission, most recent episode mixed: Secondary | ICD-10-CM | POA: Diagnosis not present

## 2016-04-04 DIAGNOSIS — F410 Panic disorder [episodic paroxysmal anxiety] without agoraphobia: Secondary | ICD-10-CM | POA: Diagnosis not present

## 2016-04-04 DIAGNOSIS — F29 Unspecified psychosis not due to a substance or known physiological condition: Secondary | ICD-10-CM | POA: Diagnosis not present

## 2016-11-10 DIAGNOSIS — F17210 Nicotine dependence, cigarettes, uncomplicated: Secondary | ICD-10-CM | POA: Diagnosis not present

## 2016-11-10 DIAGNOSIS — F411 Generalized anxiety disorder: Secondary | ICD-10-CM | POA: Diagnosis not present

## 2016-11-10 DIAGNOSIS — Z6829 Body mass index (BMI) 29.0-29.9, adult: Secondary | ICD-10-CM | POA: Diagnosis not present

## 2016-11-10 DIAGNOSIS — Z882 Allergy status to sulfonamides status: Secondary | ICD-10-CM | POA: Diagnosis not present

## 2016-11-10 DIAGNOSIS — F209 Schizophrenia, unspecified: Secondary | ICD-10-CM | POA: Diagnosis not present

## 2016-11-10 DIAGNOSIS — R45851 Suicidal ideations: Secondary | ICD-10-CM | POA: Diagnosis not present

## 2016-11-10 DIAGNOSIS — F1994 Other psychoactive substance use, unspecified with psychoactive substance-induced mood disorder: Secondary | ICD-10-CM | POA: Diagnosis not present

## 2016-11-10 DIAGNOSIS — M25511 Pain in right shoulder: Secondary | ICD-10-CM | POA: Diagnosis not present

## 2016-11-10 DIAGNOSIS — F329 Major depressive disorder, single episode, unspecified: Secondary | ICD-10-CM | POA: Diagnosis not present

## 2016-11-10 DIAGNOSIS — F909 Attention-deficit hyperactivity disorder, unspecified type: Secondary | ICD-10-CM | POA: Diagnosis not present

## 2016-11-10 DIAGNOSIS — F419 Anxiety disorder, unspecified: Secondary | ICD-10-CM | POA: Diagnosis not present

## 2016-11-10 DIAGNOSIS — M25531 Pain in right wrist: Secondary | ICD-10-CM | POA: Diagnosis not present

## 2016-11-11 DIAGNOSIS — F909 Attention-deficit hyperactivity disorder, unspecified type: Secondary | ICD-10-CM | POA: Diagnosis not present

## 2016-11-11 DIAGNOSIS — M25511 Pain in right shoulder: Secondary | ICD-10-CM | POA: Diagnosis not present

## 2016-11-11 DIAGNOSIS — F419 Anxiety disorder, unspecified: Secondary | ICD-10-CM | POA: Diagnosis not present

## 2016-11-11 DIAGNOSIS — F209 Schizophrenia, unspecified: Secondary | ICD-10-CM | POA: Diagnosis not present

## 2016-11-11 DIAGNOSIS — F17210 Nicotine dependence, cigarettes, uncomplicated: Secondary | ICD-10-CM | POA: Diagnosis not present

## 2016-11-11 DIAGNOSIS — F329 Major depressive disorder, single episode, unspecified: Secondary | ICD-10-CM | POA: Diagnosis not present

## 2016-11-11 DIAGNOSIS — Z882 Allergy status to sulfonamides status: Secondary | ICD-10-CM | POA: Diagnosis not present

## 2016-11-11 DIAGNOSIS — M25531 Pain in right wrist: Secondary | ICD-10-CM | POA: Diagnosis not present

## 2016-11-11 DIAGNOSIS — R45851 Suicidal ideations: Secondary | ICD-10-CM | POA: Diagnosis not present

## 2016-11-12 DIAGNOSIS — M25531 Pain in right wrist: Secondary | ICD-10-CM | POA: Diagnosis not present

## 2016-11-12 DIAGNOSIS — F17210 Nicotine dependence, cigarettes, uncomplicated: Secondary | ICD-10-CM | POA: Diagnosis not present

## 2016-11-12 DIAGNOSIS — R45851 Suicidal ideations: Secondary | ICD-10-CM | POA: Diagnosis not present

## 2016-11-12 DIAGNOSIS — F209 Schizophrenia, unspecified: Secondary | ICD-10-CM | POA: Diagnosis not present

## 2016-11-12 DIAGNOSIS — Z882 Allergy status to sulfonamides status: Secondary | ICD-10-CM | POA: Diagnosis not present

## 2016-11-12 DIAGNOSIS — F329 Major depressive disorder, single episode, unspecified: Secondary | ICD-10-CM | POA: Diagnosis not present

## 2016-11-12 DIAGNOSIS — F419 Anxiety disorder, unspecified: Secondary | ICD-10-CM | POA: Diagnosis not present

## 2016-11-12 DIAGNOSIS — F909 Attention-deficit hyperactivity disorder, unspecified type: Secondary | ICD-10-CM | POA: Diagnosis not present

## 2016-11-12 DIAGNOSIS — M25511 Pain in right shoulder: Secondary | ICD-10-CM | POA: Diagnosis not present

## 2017-01-31 ENCOUNTER — Emergency Department
Admission: EM | Admit: 2017-01-31 | Discharge: 2017-02-04 | Disposition: A | Discharge status: Home / Self Care | Payer: Self-pay | Attending: Emergency Medicine | Admitting: Emergency Medicine

## 2017-01-31 DIAGNOSIS — R456 Violent behavior: Secondary | ICD-10-CM | POA: Diagnosis not present

## 2017-01-31 DIAGNOSIS — F99 Mental disorder, not otherwise specified: Secondary | ICD-10-CM | POA: Diagnosis not present

## 2017-01-31 DIAGNOSIS — F319 Bipolar disorder, unspecified: Secondary | ICD-10-CM

## 2017-01-31 DIAGNOSIS — F1994 Other psychoactive substance use, unspecified with psychoactive substance-induced mood disorder: Secondary | ICD-10-CM

## 2017-01-31 DIAGNOSIS — Z91199 Patient's noncompliance with other medical treatment and regimen due to unspecified reason: Secondary | ICD-10-CM

## 2017-01-31 DIAGNOSIS — Z9119 Patient's noncompliance with other medical treatment and regimen: Secondary | ICD-10-CM

## 2017-01-31 DIAGNOSIS — F23 Brief psychotic disorder: Secondary | ICD-10-CM | POA: Diagnosis not present

## 2017-01-31 DIAGNOSIS — F1510 Other stimulant abuse, uncomplicated: Secondary | ICD-10-CM | POA: Insufficient documentation

## 2017-01-31 DIAGNOSIS — F17210 Nicotine dependence, cigarettes, uncomplicated: Secondary | ICD-10-CM | POA: Insufficient documentation

## 2017-01-31 DIAGNOSIS — F15951 Other stimulant use, unspecified with stimulant-induced psychotic disorder with hallucinations: Secondary | ICD-10-CM

## 2017-01-31 DIAGNOSIS — F1210 Cannabis abuse, uncomplicated: Secondary | ICD-10-CM

## 2017-01-31 DIAGNOSIS — Z79899 Other long term (current) drug therapy: Secondary | ICD-10-CM | POA: Insufficient documentation

## 2017-01-31 LAB — CBC
HCT: 48.2 % (ref 40.0–52.0)
Hemoglobin: 17 g/dL (ref 13.0–18.0)
MCH: 30.4 pg (ref 26.0–34.0)
MCHC: 35.3 g/dL (ref 32.0–36.0)
MCV: 86.3 fL (ref 80.0–100.0)
PLATELETS: 367 10*3/uL (ref 150–440)
RBC: 5.58 MIL/uL (ref 4.40–5.90)
RDW: 13.3 % (ref 11.5–14.5)
WBC: 17.2 10*3/uL — ABNORMAL HIGH (ref 3.8–10.6)

## 2017-01-31 MED ORDER — DIPHENHYDRAMINE HCL 50 MG/ML IJ SOLN
50.0000 mg | Freq: Once | INTRAMUSCULAR | Status: AC
  Administered 2017-01-31: 50 mg via INTRAVENOUS

## 2017-01-31 MED ORDER — ZIPRASIDONE MESYLATE 20 MG IM SOLR
20.0000 mg | Freq: Once | INTRAMUSCULAR | Status: AC
  Administered 2017-01-31: 20 mg via INTRAMUSCULAR

## 2017-02-01 ENCOUNTER — Encounter: Payer: Self-pay | Admitting: Emergency Medicine

## 2017-02-01 ENCOUNTER — Emergency Department: Payer: Self-pay

## 2017-02-01 DIAGNOSIS — F209 Schizophrenia, unspecified: Secondary | ICD-10-CM | POA: Insufficient documentation

## 2017-02-01 DIAGNOSIS — F200 Paranoid schizophrenia: Secondary | ICD-10-CM

## 2017-02-01 LAB — COMPREHENSIVE METABOLIC PANEL
ALBUMIN: 5.9 g/dL — AB (ref 3.5–5.0)
ALK PHOS: 121 U/L (ref 38–126)
ALT: 36 U/L (ref 17–63)
ANION GAP: 17 — AB (ref 5–15)
AST: 47 U/L — ABNORMAL HIGH (ref 15–41)
BILIRUBIN TOTAL: 1.5 mg/dL — AB (ref 0.3–1.2)
BUN: 19 mg/dL (ref 6–20)
CALCIUM: 10.1 mg/dL (ref 8.9–10.3)
CHLORIDE: 101 mmol/L (ref 101–111)
CO2: 23 mmol/L (ref 22–32)
Creatinine, Ser: 1.38 mg/dL — ABNORMAL HIGH (ref 0.61–1.24)
GFR calc Af Amer: >60 mL/min (ref >60)
GFR calc non Af Amer: >60 mL/min (ref >60)
GLUCOSE: 135 mg/dL — AB (ref 65–99)
Potassium: 3 mmol/L — ABNORMAL LOW (ref 3.5–5.1)
Sodium: 141 mmol/L (ref 135–145)
Total Protein: 9 g/dL — ABNORMAL HIGH (ref 6.5–8.1)

## 2017-02-01 LAB — URINE DRUG SCREEN, QUALITATIVE (ARMC ONLY)
AMPHETAMINES, UR SCREEN: POSITIVE — AB
BENZODIAZEPINE, UR SCRN: POSITIVE — AB
Barbiturates, Ur Screen: NOT DETECTED
Cannabinoid 50 Ng, Ur ~~LOC~~: POSITIVE — AB
Cocaine Metabolite,Ur ~~LOC~~: NOT DETECTED
MDMA (ECSTASY) UR SCREEN: NOT DETECTED
METHADONE SCREEN, URINE: NOT DETECTED
Opiate, Ur Screen: NOT DETECTED
PHENCYCLIDINE (PCP) UR S: NOT DETECTED
TRICYCLIC, UR SCREEN: NOT DETECTED

## 2017-02-01 LAB — RAPID HIV SCREEN (HIV 1/2 AB+AG)
HIV 1/2 ANTIBODIES: NONREACTIVE
HIV-1 P24 ANTIGEN - HIV24: NONREACTIVE

## 2017-02-01 LAB — ETHANOL: Alcohol, Ethyl (B): <5 mg/dL (ref <5)

## 2017-02-01 LAB — SALICYLATE LEVEL

## 2017-02-01 LAB — ACETAMINOPHEN LEVEL

## 2017-02-01 MED ORDER — ACETAMINOPHEN 500 MG PO TABS
1000.0000 mg | ORAL_TABLET | Freq: Once | ORAL | Status: AC
  Administered 2017-02-01: 1000 mg via ORAL

## 2017-02-01 MED ORDER — DIPHENHYDRAMINE HCL 50 MG/ML IJ SOLN
50.0000 mg | Freq: Once | INTRAMUSCULAR | Status: AC
  Administered 2017-02-01: 50 mg via INTRAMUSCULAR

## 2017-02-01 MED ORDER — ACETAMINOPHEN 500 MG PO TABS
ORAL_TABLET | ORAL | Status: AC
  Administered 2017-02-01: 1000 mg via ORAL
  Filled 2017-02-01: qty 2

## 2017-02-01 MED ORDER — LORAZEPAM 2 MG/ML IJ SOLN
2.0000 mg | Freq: Once | INTRAMUSCULAR | Status: AC
  Administered 2017-02-01: 2 mg via INTRAMUSCULAR

## 2017-02-01 MED ORDER — ZIPRASIDONE MESYLATE 20 MG IM SOLR
INTRAMUSCULAR | Status: AC
  Filled 2017-02-01: qty 20

## 2017-02-01 MED ORDER — ZIPRASIDONE MESYLATE 20 MG IM SOLR
20.0000 mg | Freq: Once | INTRAMUSCULAR | Status: AC
  Administered 2017-02-01: 20 mg via INTRAMUSCULAR

## 2017-02-01 MED ORDER — QUETIAPINE FUMARATE 25 MG PO TABS
100.0000 mg | ORAL_TABLET | Freq: Three times a day (TID) | ORAL | Status: DC
  Administered 2017-02-01 – 2017-02-02 (×2): 100 mg via ORAL
  Filled 2017-02-01 (×2): qty 4

## 2017-02-01 MED ORDER — DIVALPROEX SODIUM 500 MG PO DR TAB
500.0000 mg | DELAYED_RELEASE_TABLET | Freq: Two times a day (BID) | ORAL | Status: DC
  Administered 2017-02-01 – 2017-02-04 (×6): 500 mg via ORAL
  Filled 2017-02-01 (×6): qty 1

## 2017-02-01 NOTE — Consult Note (Signed)
[name]   Reason for Consult:  Consult for 26 year old man with history of schizophrenia who presented to the emergency room highly agitated and threatening Referring Physician:  Archie Balboa Patient Identification: Darin Wood MRN:  [id number] Principal Diagnosis: Schizophrenia [facility]) Diagnosis:   Patient Active Problem List   Diagnosis Date Noted  . Schizophrenia ([place]) [F20.9] 02/01/2017    Total Time spent with patient: 45 minutes  Subjective:   Darin Wood is a 26 y.o. male patient admitted with patient not able to give information.  HPI:  This 26 year old man with a history of schizophrenia came to the emergency room yesterday and was agitated and intermittently violent. He required forced sedating medication. Since that time every time I have tried to talk to him he has been over sedated and unable to interact. Apparently there have been some moments that he is woken up but has immediately gotten agitated again. I tried to talk to him this evening and his speech was slurred and he was unable to answer any questions.  Substance abuse history: Unknown  Family history: Unknown  Past Psychiatric History: Patient has a history of schizophrenia paranoia agitation threatening behavior.  Risk to Self: Is patient at risk for suicide?: Yes Risk to Others:   Prior Inpatient Therapy:   Prior Outpatient Therapy:    Past Medical History:  Past Medical History:  Diagnosis Date  . Bipolar 2 disorder ([place])   . Borderline personality disorder   . Suicide attempt [facility])    History reviewed. No pertinent surgical history. Family History:  Family History  Problem Relation Age of Onset  . Diabetes Mother   . Pancreatitis Father    Family Psychiatric  History: Unknown Social History:  History  Alcohol Use  . Yes    Comment: regularly     History  Drug use: Unknown    Social History   Social History  . Marital status: Single    Spouse name: N/A  .  Number of children: N/A  . Years of education: N/A   Social History Main Topics  . Smoking status: Current Every Day Smoker    Packs/day: 1.00    Types: Cigarettes  . Smokeless tobacco: Never Used  . Alcohol use Yes     Comment: regularly  . Drug use: Unknown  . Sexual activity: Not Asked   Other Topics Concern  . None   Social History Narrative  . None   Additional Social History:    Allergies:   Allergies  Allergen Reactions  . Ceclor [Cefaclor] Hives  . Sulfa Antibiotics Hives  . Toradol [Ketorolac Tromethamine] Nausea And Vomiting  . Tramadol   . Haldol [Haloperidol Lactate] Anxiety    Labs:  Results for orders placed or performed during the hospital encounter of 01/31/17 (from the past 48 hour(s))  Comprehensive metabolic panel     Status: Abnormal   Collection Time: 01/31/17 11:35 PM  Result Value Ref Range   Sodium 141 135 - 145 mmol/L   Potassium 3.0 (L) 3.5 - 5.1 mmol/L   Chloride 101 101 - 111 mmol/L   CO2 23 22 - 32 mmol/L   Glucose, Bld 135 (H) 65 - 99 mg/dL   BUN 19 6 - 20 mg/dL   Creatinine, Ser 1.38 (H) 0.61 - 1.24 mg/dL   Calcium 10.1 8.9 - 10.3 mg/dL   Total Protein 9.0 (H) 6.5 - 8.1 g/dL   Albumin 5.9 (H) 3.5 - 5.0 g/dL   AST 47 (H)  15 - 41 U/L   ALT 36 17 - 63 U/L   Alkaline Phosphatase 121 38 - 126 U/L   Total Bilirubin 1.5 (H) 0.3 - 1.2 mg/dL   GFR calc non Af Amer >60 >60 mL/min   GFR calc Af Amer >60 >60 mL/min    Comment: (NOTE) The eGFR has been calculated using the CKD EPI equation. This calculation has not been validated in all clinical situations. eGFR's persistently <60 mL/min signify possible Chronic Kidney Disease.    Anion gap 17 (H) 5 - 15  Ethanol     Status: None   Collection Time: 01/31/17 11:35 PM  Result Value Ref Range   Alcohol, Ethyl (B) <5 <5 mg/dL    Comment:        LOWEST DETECTABLE LIMIT FOR SERUM ALCOHOL IS 5 mg/dL FOR MEDICAL PURPOSES ONLY   Salicylate level     Status: None   Collection Time:  01/31/17 11:35 PM  Result Value Ref Range   Salicylate Lvl <2.2 2.8 - 30.0 mg/dL  Acetaminophen level     Status: Abnormal   Collection Time: 01/31/17 11:35 PM  Result Value Ref Range   Acetaminophen (Tylenol), Serum <10 (L) 10 - 30 ug/mL    Comment:        THERAPEUTIC CONCENTRATIONS VARY SIGNIFICANTLY. A RANGE OF 10-30 ug/mL MAY BE AN EFFECTIVE CONCENTRATION FOR MANY PATIENTS. HOWEVER, SOME ARE BEST TREATED AT CONCENTRATIONS OUTSIDE THIS RANGE. ACETAMINOPHEN CONCENTRATIONS >150 ug/mL AT 4 HOURS AFTER INGESTION AND >50 ug/mL AT 12 HOURS AFTER INGESTION ARE OFTEN ASSOCIATED WITH TOXIC REACTIONS.   cbc     Status: Abnormal   Collection Time: 01/31/17 11:35 PM  Result Value Ref Range   WBC 17.2 (H) 3.8 - 10.6 K/uL   RBC 5.58 4.40 - 5.90 MIL/uL   Hemoglobin 17.0 13.0 - 18.0 g/dL   HCT 48.2 40.0 - 52.0 %   MCV 86.3 80.0 - 100.0 fL   MCH 30.4 26.0 - 34.0 pg   MCHC 35.3 32.0 - 36.0 g/dL   RDW 13.3 11.5 - 14.5 %   Platelets 367 150 - 440 K/uL  Rapid HIV screen (HIV 1/2 Ab+Ag)     Status: None   Collection Time: 01/31/17 11:35 PM  Result Value Ref Range   HIV-1 P24 Antigen - HIV24 NON REACTIVE NON REACTIVE   HIV 1/2 Antibodies NON REACTIVE NON REACTIVE   Interpretation (HIV Ag Ab)      A non reactive test result means that HIV 1 or HIV 2 antibodies and HIV 1 p24 antigen were not detected in the specimen.  Urine Drug Screen, Qualitative     Status: Abnormal   Collection Time: 01/31/17 11:40 PM  Result Value Ref Range   Tricyclic, Ur Screen NONE DETECTED NONE DETECTED   Amphetamines, Ur Screen POSITIVE (A) NONE DETECTED   MDMA (Ecstasy)Ur Screen NONE DETECTED NONE DETECTED   Cocaine Metabolite,Ur [place] NONE DETECTED NONE DETECTED   Opiate, Ur Screen NONE DETECTED NONE DETECTED   Phencyclidine (PCP) Ur S NONE DETECTED NONE DETECTED   Cannabinoid 50 Ng, Ur [place] POSITIVE (A) NONE DETECTED   Barbiturates, Ur Screen NONE DETECTED NONE DETECTED   Benzodiazepine, Ur Scrn POSITIVE (A)  NONE DETECTED   Methadone Scn, Ur NONE DETECTED NONE DETECTED    Comment: (NOTE) 979  Tricyclics, urine               Cutoff 1000 ng/mL 200  Amphetamines, urine  Cutoff 1000 ng/mL 300  MDMA (Ecstasy), urine           Cutoff 500 ng/mL 400  Cocaine Metabolite, urine       Cutoff 300 ng/mL 500  Opiate, urine                   Cutoff 300 ng/mL 600  Phencyclidine (PCP), urine      Cutoff 25 ng/mL 700  Cannabinoid, urine              Cutoff 50 ng/mL 800  Barbiturates, urine             Cutoff 200 ng/mL 900  Benzodiazepine, urine           Cutoff 200 ng/mL 1000 Methadone, urine                Cutoff 300 ng/mL 1100 1200 The urine drug screen provides only a preliminary, unconfirmed 1300 analytical test result and should not be used for non-medical 1400 purposes. Clinical consideration and professional judgment should 1500 be applied to any positive drug screen result due to possible 1600 interfering substances. A more specific alternate chemical method 1700 must be used in order to obtain a confirmed analytical result.  1800 Gas chromato graphy / mass spectrometry (GC/MS) is the preferred 1900 confirmatory method.     Current Facility-Administered Medications  Medication Dose Route Frequency Provider Last Rate Last Dose  . divalproex (DEPAKOTE) DR tablet 500 mg  500 mg Oral Q12H Clapacs, John T, MD      . QUEtiapine (SEROQUEL) tablet 100 mg  100 mg Oral TID Clapacs, Madie Reno, MD       Current Outpatient Prescriptions  Medication Sig Dispense Refill  . gabapentin (NEURONTIN) 100 MG capsule Take 1 capsule (100 mg total) by mouth 3 (three) times daily. 30 capsule 0  . omeprazole (PRILOSEC) 20 MG capsule Take 20 mg by mouth daily.    . risperiDONE (RISPERDAL) 0.5 MG tablet Take 0.5 mg by mouth 3 (three) times daily.    . traZODone (DESYREL) 150 MG tablet Take 1 tablet (150 mg total) by mouth at bedtime. 10 tablet 0  . albuterol (PROVENTIL HFA;VENTOLIN HFA) 108 (90 BASE) MCG/ACT  inhaler Inhale 2 puffs into the lungs every 6 (six) hours as needed for wheezing or shortness of breath. (Patient not taking: Reported on 02/01/2017) 8 g 0  . amoxicillin-clavulanate (AUGMENTIN) 875-125 MG tablet Take 1 tablet by mouth every 12 (twelve) hours. (Patient not taking: Reported on 02/01/2017) 20 tablet 0  . buPROPion (WELLBUTRIN SR) 150 MG 12 hr tablet Take 1 tablet (150 mg total) by mouth 3 (three) times daily. (Patient not taking: Reported on 02/01/2017) 30 tablet 0  . citalopram (CELEXA) 40 MG tablet Take 1 tablet (40 mg total) by mouth daily. (Patient not taking: Reported on 02/01/2017) 10 tablet 0  . HYDROcodone-acetaminophen (NORCO/VICODIN) 5-325 MG tablet Take 1 tablet by mouth every 8 (eight) hours as needed for moderate pain or severe pain (do not drive or operate machinery while taking as can cause drowsiness). (Patient not taking: Reported on 02/01/2017) 10 tablet 0  . ibuprofen (ADVIL,MOTRIN) 600 MG tablet Take 1 tablet (600 mg total) by mouth every 6 (six) hours as needed. (Patient not taking: Reported on 02/01/2017) 30 tablet 0  . methocarbamol (ROBAXIN-750) 750 MG tablet Take 2 tablets (1,500 mg total) by mouth 4 (four) times daily. (Patient not taking: Reported on 02/01/2017) 40 tablet 0  . mupirocin ointment (BACTROBAN) 2 %  Apply three times a day for 5 days. (Patient not taking: Reported on 02/01/2017) 22 g 0  . oxyCODONE-acetaminophen (PERCOCET) 7.5-325 MG tablet Take 1 tablet by mouth every 4 (four) hours as needed for severe pain. (Patient not taking: Reported on 02/01/2017) 20 tablet 0  . QUEtiapine (SEROQUEL) 100 MG tablet Take 1 tablet (100 mg total) by mouth 3 (three) times daily. (Patient not taking: Reported on 02/01/2017) 30 tablet 0    Musculoskeletal: Strength & Muscle Tone: decreased Gait & Station: unable to stand Patient leans: N/A  Psychiatric Specialty Exam: Physical Exam  Nursing note and vitals reviewed. Constitutional: He appears well-developed and  well-nourished.  HENT:  Head: Normocephalic and atraumatic.  Eyes: Pupils are equal, round, and reactive to light. Conjunctivae are normal.  Neck: Normal range of motion.  Cardiovascular: Normal heart sounds.   Respiratory: Effort normal.  GI: Soft.  Musculoskeletal: Normal range of motion.  Neurological: He is alert.  Skin: Skin is warm and dry.  Psychiatric: His affect is blunt. His speech is delayed. He is withdrawn. He expresses impulsivity.    Review of Systems  Unable to perform ROS: Psychiatric disorder    Blood pressure 133/66, pulse 75, temperature 97.8 F (36.6 C), temperature source Oral, resp. rate 18, height _0  (1.651 m), weight 72.6 kg (160 lb), SpO2 100 %.Body mass index is 26.63 kg/m.  General Appearance: Disheveled  Eye Contact:  None  Speech:  Garbled and Slurred  Volume:  Decreased  Mood:  Dysphoric  Affect:  Constricted  Thought Process:  Disorganized  Orientation:  Negative  Thought Content:  Negative  Suicidal Thoughts:  Unknown  Homicidal Thoughts:  Unknown  Memory:  Negative  Judgement:  Negative  Insight:  Negative  Psychomotor Activity:  Negative  Concentration:  Concentration: Negative  Recall:  Negative  Fund of Knowledge:  Negative  Language:  Negative  Akathisia:  Negative  Handed:  Right  AIMS (if indicated):     Assets:  Physical Health  ADL's:  Impaired  Cognition:  Impaired,  Severe  Sleep:        Treatment Plan Summary: Medication management and Plan 26 year old man with a history of schizophrenia or schizoaffective disorder. So far has been so sedated because of his agitation that I have not been able to have a lucid interaction with him. I'm going to go ahead and put in orders to restart the Depakote 500 twice a day and Seroquel 103 times a day that were mainstays of his previous psychiatric medicine. We can continue to try to interact him over the weekend to finally get an idea about what is going on. IVC  Disposition:  Patient is still resistant to appropriate interview and workup  Alethia Berthold, MD 02/01/2017 8:19 PM
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
## 2017-02-01 NOTE — ED Notes (Signed)
Pt threw the sandwich tray provided on the floor. Pt went back to sleep.
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
## 2017-02-01 NOTE — ED Notes (Signed)
Pt asked for and was provided a cup of water with ice. Pt's behavior is calm and cooperative.

## 2017-02-01 NOTE — ED Notes (Addendum)
Call to Daiel Strohecker (pt wife) [phone] and Sohrab Keelan (pt mother) [phone], at pt's request to "see if my wife is ok"  Nehal Shives reports: "she's fine I've been talking to her all day and we'll come see him tomorrow"  Pt given extra pillow and message  C/o of hand pain  Meal tray ordered
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
## 2017-02-01 NOTE — ED Provider Notes (Signed)
-----------------------------------------   12:29 PM on 02/01/2017 -----------------------------------------  Patient starting to act up. He is becoming agitated. Stating that he will hurt staff and himself if he cannot speak to his wife. We tried to discuss with the patient that he is under IVC however this only further agitated the patient. Given concern for harm to the staff as well as harm to the patient himself will give patient medication to try to help calm him down.  ----------------------------------------- 2:26 PM on 02/01/2017 -----------------------------------------  Patient is now sleeping. I did obtain an x-ray of the patient's right hand given that he had been complaining of some pain and there is some swelling to it.no acute fracture noted.   Phineas Semen, MD 02/01/17 [phone]

## 2017-02-01 NOTE — ED Notes (Signed)
Restraints removed after obtaining verbal order from MD; pt is calm, and asleep on the stretcher; pt respiratory status is WDL, pt's skin color is WDL; pt will be closely monitored.

## 2017-02-01 NOTE — ED Notes (Signed)
Pt is now standing in doorway of his room demanding that staff call his mother and wife, this tech explained to pt that it is not phone hours and that I would relay his wishes to the RN. Pt states " I told the damn nurse and he said no" I explained to pt that the Rn is the one that allows phone privileges

## 2017-02-01 NOTE — ED Notes (Signed)
Pt awakened by this tech to obtain vital signs per RN Danelle Earthly verbal order. Pt is now yelling at this tech stating " I want to use the GD phone, why the fuck can't I call my wife?" pt appears to be extremely agitated at this time
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
## 2017-02-01 NOTE — ED Notes (Signed)
Pt states he is hearing voices; pt also asked for and received a sandwich tray and cup of ice water. MD notified of patient stating that he hears voices.

## 2017-02-02 LAB — HEPATITIS PANEL, ACUTE
Hep A IgM: NEGATIVE
Hep B C IgM: NEGATIVE
Hepatitis B Surface Ag: NEGATIVE

## 2017-02-02 MED ORDER — ACETAMINOPHEN 500 MG PO TABS
1000.0000 mg | ORAL_TABLET | Freq: Three times a day (TID) | ORAL | Status: DC | PRN
  Administered 2017-02-02 – 2017-02-03 (×2): 1000 mg via ORAL
  Filled 2017-02-02 (×2): qty 2

## 2017-02-02 MED ORDER — QUETIAPINE FUMARATE 200 MG PO TABS
200.0000 mg | ORAL_TABLET | Freq: Every day | ORAL | Status: DC
  Administered 2017-02-03: 200 mg via ORAL
  Filled 2017-02-02: qty 1

## 2017-02-02 NOTE — ED Notes (Signed)
Patient awakens when spoken to and gentle shake but does not stay awake long enough to take meds. Will allow to cont to sleep at this time.

## 2017-02-02 NOTE — ED Notes (Addendum)
Pt was calm enough to offer a shower. Pt accepted invitation to shower, pt changed his top,pants,underware and socks. Pt gave all offered materials back. Pt's bed linens were changed. Pt st" I feel dizzy" and I assisted pt back to bed. Pt upset and st" I wanted to call my wife. I want to talk to my wife". This EDT gave pt a sticky note with the phone hours and visitation times so that he knew when he would be able to call his family. Pt requests that saff please wake him up during phone hours so that he may use the phone" This EDT told RN on shift and will relay this in hand-off report. Pt now calm lying in bed. Pt was offered food and beverage but has yet to consume such. Will continue to monitor.

## 2017-02-02 NOTE — ED Notes (Signed)
Pt calm when approached by this EDT. Pt was given a sandwich tray and a sprite per request. Pt st "ok just set it over there I'll get it:" Pt was asked if he needed anything at this time, pt st "no". Pt awake lying in bed at this time,

## 2017-02-02 NOTE — ED Notes (Signed)

## 2017-02-02 NOTE — ED Notes (Signed)
Pt calm at this time and allowed this EDT to take his vitals. Pt c/o generalized pain to bi-lat sides, wrists and arms. Nothing asked of this EDT or staff at this time. Pt has returned to sleep. Environment safe and secure.

## 2017-02-02 NOTE — BH Assessment (Signed)
Assessment Note  Darin Wood is an 26 y.o. male. The patient came in due to having visual hallucinations.  He reported he was seeing demons prior to his admission.  He denies seeing them now.  He denies audial hallucinations, but admits to having audial hallucinations in the past.  He reports he started having hallucinations due to not having seroquel for the past 2 weeks.  He currently goes to [facility] and receives his medication from them.    He denies any SA use.  However, his UDA was positive for amphetamines, marijuana, and benzodiazepine.  He denies any legal problems, but past records show the patient was previously in jail.  He describes his biggest stressor as not being able to talk to his wife and not getting his medication.  The patient refused to make eye contact throughout the assessment.  He currently denies SI, HI, and SA.  Diagnosis: Unspecified Schizophrenia Spectrum Disorder  Past Medical History:  Past Medical History:  Diagnosis Date  . Bipolar 2 disorder (HCC)   . Borderline personality disorder   . Suicide attempt [facility] [place])     History reviewed. No pertinent surgical history.  Family History:  Family History  Problem Relation Age of Onset  . Diabetes Mother   . Pancreatitis Father     Social History:  reports that he has been smoking Cigarettes.  He has been smoking about 1.00 pack per day. He has never used smokeless tobacco. He reports that he drinks alcohol. His drug history is not on file.  Additional Social History:  Alcohol / Drug Use Pain Medications: See PTA Prescriptions: See PTA Over the Counter: See PTA History of alcohol / drug use?: Yes Substance #1 Name of Substance 1: marijuana  CIWA: CIWA-Ar BP: 122/68 Pulse Rate: 75 COWS:    Allergies:  Allergies  Allergen Reactions  . Ceclor [Cefaclor] Hives  . Sulfa Antibiotics Hives  . Toradol [Ketorolac Tromethamine] Nausea And Vomiting  . Tramadol   . Haldol [Haloperidol  Lactate] Anxiety    Home Medications:  (Not in a hospital admission)  OB/GYN Status:  No LMP for male patient.  General Assessment Data Location of Assessment: [facility] ED TTS Assessment: In system Is this a Tele or Face-to-Face Assessment?: Face-to-Face Is this an Initial Assessment or a Re-assessment for this encounter?: Initial Assessment Marital status: Married Darin Wood name: NA Living Arrangements: Spouse/significant other, Children Can pt return to current living arrangement?: Yes Admission Status: Involuntary Referral Source: Self/Family/Friend Insurance type: none     Crisis Care Plan Living Arrangements: Spouse/significant other, Children Legal Guardian: Other: (self) Name of Psychiatrist: [facility] Name of Therapist: none  Education Status Is patient currently in school?: No Current Grade: NA Highest grade of school patient has completed: 11th Name of school: NA Contact person: NA  Risk to self with the past 6 months Suicidal Ideation: No Has patient been a risk to self within the past 6 months prior to admission? : No Suicidal Intent: No Has patient had any suicidal intent within the past 6 months prior to admission? : No Is patient at risk for suicide?: No Suicidal Plan?: No Has patient had any suicidal plan within the past 6 months prior to admission? : No Access to Means: No What has been your use of drugs/alcohol within the last 12 months?: marijuana use Previous Attempts/Gestures: No Other Self Harm Risks: none Triggers for Past Attempts: Unknown Intentional Self Injurious Behavior: None Family Suicide History: Unknown Recent stressful life event(s): Other (  Comment) (pt upset he has not talked to his wife) Persecutory voices/beliefs?: Yes Depression: No Depression Symptoms: Insomnia Substance abuse history and/or treatment for substance abuse?: Yes Suicide prevention information given to non-admitted patients: Not applicable  Risk to Others  within the past 6 months Homicidal Ideation: No Does patient have any lifetime risk of violence toward others beyond the six months prior to admission? : Yes (comment) (violent at admission) Thoughts of Harm to Others: No Current Homicidal Intent: No Current Homicidal Plan: No Access to Homicidal Means: No Identified Victim: none History of harm to others?: No Assessment of Violence: On admission Violent Behavior Description: threatening to hurt others when admitted. Does patient have access to weapons?: No Criminal Charges Pending?: No Does patient have a court date: No Is patient on probation?: Yes  Psychosis Hallucinations: Visual Delusions: None noted  Mental Status Report Appearance/Hygiene: Unremarkable, In scrubs Eye Contact: Poor Motor Activity: Unremarkable, Freedom of movement Speech: Logical/coherent Level of Consciousness: Alert Mood: Irritable Affect: Angry Anxiety Level: Minimal Thought Processes: Relevant, Coherent Judgement: Impaired Orientation: Person, Place, Time, Situation, Appropriate for developmental age Obsessive Compulsive Thoughts/Behaviors: None  Cognitive Functioning Concentration: Decreased Memory: Recent Intact, Remote Intact IQ: Average Insight: Poor Impulse Control: Poor Appetite: Fair Weight Loss: 0 Weight Gain: 0 Sleep: Decreased Total Hours of Sleep: 4 Vegetative Symptoms: None  ADLScreening [facility] Assessment Services) Patient's cognitive ability adequate to safely complete daily activities?: Yes Patient able to express need for assistance with ADLs?: Yes Independently performs ADLs?: Yes (appropriate for developmental age)  Prior Inpatient Therapy Prior Inpatient Therapy: Yes Prior Therapy Dates: 2016 Prior Therapy Facilty/Provider(s): [facility] Reason for Treatment: SI  Prior Outpatient Therapy Prior Outpatient Therapy: Yes Prior Therapy Dates: Present Prior Therapy Facilty/Provider(s): [facility] Reason for Treatment:  psychosis Does patient have an ACCT team?: No Does patient have Intensive In-House Services?  : No Does patient have [facility] services? : No Does patient have P4CC services?: No  ADL Screening (condition at time of admission) Patient's cognitive ability adequate to safely complete daily activities?: Yes Is the patient deaf or have difficulty hearing?: No Does the patient have difficulty seeing, even when wearing glasses/contacts?: No Does the patient have difficulty concentrating, remembering, or making decisions?: No Patient able to express need for assistance with ADLs?: Yes Does the patient have difficulty dressing or bathing?: No Independently performs ADLs?: Yes (appropriate for developmental age) Does the patient have difficulty walking or climbing stairs?: No Weakness of Legs: None Weakness of Arms/Hands: None  Home Assistive Devices/Equipment Home Assistive Devices/Equipment: None  Therapy Consults (therapy consults require a physician order) PT Evaluation Needed: No OT Evalulation Needed: No SLP Evaluation Needed: No Abuse/Neglect Assessment (Assessment to be complete while patient is alone) Physical Abuse: Denies Verbal Abuse: Denies Sexual Abuse: Denies Exploitation of patient/patient's resources: Denies Self-Neglect: Denies Values / Beliefs Cultural Requests During Hospitalization: None Spiritual Requests During Hospitalization: None Consults Spiritual Care Consult Needed: No Social Work Consult Needed: No      Additional Information 1:1 In Past 12 Months?: No CIRT Risk: Yes Elopement Risk: Yes Does patient have medical clearance?: Yes     Disposition:  Disposition Initial Assessment Completed for this Encounter: Yes Disposition of Patient: Other dispositions (Will see psychiatrist)  On Site Evaluation by:   Reviewed with Physician:    Ottis Stain 02/02/2017 9:40 PM

## 2017-02-02 NOTE — ED Notes (Signed)

## 2017-02-02 NOTE — ED Notes (Signed)
Patients mother called to check on pt. Explained to mother that due to [employer] regulations I could only let her know that the pt is here and is safe. Will ask pt to call her in the morning during phone hours.

## 2017-02-02 NOTE — ED Notes (Signed)
BEHAVIORAL HEALTH ROUNDING Patient sleeping: Yes.   Patient alert and oriented: not applicable Behavior appropriate: Yes.  ; If no, describe:  Nutrition and fluids offered: No Toileting and hygiene offered: No Sitter present: not applicable Law enforcement present: Yes  

## 2017-02-02 NOTE — ED Notes (Signed)
Pt reports pain to his right hand. Rates his pain at 7/10. Pt also asking to have a bath and use the phone to cal his wife. Explained to pt about phone hours and he requested for this RN to call his wife and tell her he is ok and that he will call her in the morning. Pt will be assisted to shower by EDT Maralyn Sago and Dr Scotty Court informed about patients co of pain.

## 2017-02-02 NOTE — ED Provider Notes (Signed)
-----------------------------------------   3:51 AM on 02/02/2017 -----------------------------------------   Blood pressure 122/76, pulse (!) 101, temperature 97.8 F (36.6 C), temperature source Oral, resp. rate 18, height  (1.651 m), weight 72.6 kg (160 lb), SpO2 99 %.  The patient had no acute events since last update.  Calm and cooperative at this time.  Disposition is pending Psychiatry/Behavioral Medicine team recommendations.     Myrna Blazer, MD 02/02/17 [phone]

## 2017-02-02 NOTE — ED Notes (Signed)
BEHAVIORAL HEALTH ROUNDING Patient sleeping: Yes.   Patient alert and oriented: not applicable SLEEPING Behavior appropriate: Yes.  ; If no, describe: SLEEPING Nutrition and fluids offered: No SLEEPING Toileting and hygiene offered: NoSLEEPING Sitter present: not applicable, Q 15 min safety rounds and observation. Law enforcement present: Yes ODS 

## 2017-02-02 NOTE — ED Notes (Signed)
BEHAVIORAL HEALTH ROUNDING Patient sleeping: Yes.   Patient alert and oriented: not applicable Behavior appropriate: Yes.  ; If no, describe:  Nutrition and fluids offered: Yes  Toileting and hygiene offered: Yes  Sitter present: not applicable Law enforcement present: Yes  

## 2017-02-03 MED ORDER — NICOTINE 14 MG/24HR TD PT24
MEDICATED_PATCH | TRANSDERMAL | Status: AC
  Administered 2017-02-03: 14 mg via TRANSDERMAL
  Filled 2017-02-03: qty 1

## 2017-02-03 MED ORDER — POTASSIUM CHLORIDE CRYS ER 20 MEQ PO TBCR
40.0000 meq | EXTENDED_RELEASE_TABLET | Freq: Once | ORAL | Status: AC
  Administered 2017-02-03: 40 meq via ORAL
  Filled 2017-02-03: qty 2

## 2017-02-03 MED ORDER — NICOTINE 14 MG/24HR TD PT24
14.0000 mg | MEDICATED_PATCH | Freq: Once | TRANSDERMAL | Status: DC
  Administered 2017-02-03: 14 mg via TRANSDERMAL

## 2017-02-03 NOTE — ED Notes (Signed)
BEHAVIORAL HEALTH ROUNDING Patient sleeping: Yes.   Patient alert and oriented: not applicable SLEEPING Behavior appropriate: Yes.  ; If no, describe: SLEEPING Nutrition and fluids offered: No SLEEPING Toileting and hygiene offered: NoSLEEPING Sitter present: not applicable, Q 15 min safety rounds and observation. Law enforcement present: Yes ODS 

## 2017-02-03 NOTE — ED Notes (Signed)
PT IVC/ PENDING REASSESSMENT

## 2017-02-03 NOTE — ED Provider Notes (Signed)
-----------------------------------------   7:13 AM on 02/03/2017 -----------------------------------------   Blood pressure 114/67, pulse 75, temperature 97.8 F (36.6 C), temperature source Oral, resp. rate 16, height  (1.651 m), weight 72.6 kg (160 lb), SpO2 98 %.  The patient had no acute events since last update.  Calm and cooperative at this time.  Disposition is pending Psychiatry/Behavioral Medicine team recommendations.     Jeanmarie Plant, MD 02/03/17 [phone]

## 2017-02-03 NOTE — ED Notes (Signed)
Woke pt up to take medications, pt states "I just want to sleep" pt was cooperative to take medications and informed him that he needed to take his meds and could go back to sleep.  Pt ate 100% of breakfast and drank 4oz of OJ.  Pt states he was not able to get anyone on the phone.  Offered Tylenol to patient, but declined.  I asked patient about SI and if he was having auditory and visual hallucinations and he replied "always" then turn over in the bed.

## 2017-02-03 NOTE — ED Notes (Signed)
Patient using the phone at this time.  Pt given passcode to give to family members.

## 2017-02-03 NOTE — ED Notes (Signed)
Offered Pt food and drink. Pt only wanted Ginger-Ale.AS

## 2017-02-03 NOTE — ED Notes (Signed)
Pt states he was unable to get in contact with anyone when he had the phone, ED tech found phone laying on bed with patient asleep

## 2017-02-04 DIAGNOSIS — F1994 Other psychoactive substance use, unspecified with psychoactive substance-induced mood disorder: Secondary | ICD-10-CM

## 2017-02-04 DIAGNOSIS — F15951 Other stimulant use, unspecified with stimulant-induced psychotic disorder with hallucinations: Secondary | ICD-10-CM

## 2017-02-04 DIAGNOSIS — Z91199 Patient's noncompliance with other medical treatment and regimen due to unspecified reason: Secondary | ICD-10-CM

## 2017-02-04 DIAGNOSIS — Z9119 Patient's noncompliance with other medical treatment and regimen: Secondary | ICD-10-CM

## 2017-02-04 DIAGNOSIS — F319 Bipolar disorder, unspecified: Secondary | ICD-10-CM

## 2017-02-04 DIAGNOSIS — F1210 Cannabis abuse, uncomplicated: Secondary | ICD-10-CM

## 2017-02-04 DIAGNOSIS — F1510 Other stimulant abuse, uncomplicated: Secondary | ICD-10-CM

## 2017-02-04 MED ORDER — DIVALPROEX SODIUM 500 MG PO DR TAB: 500.0000 mg | DELAYED_RELEASE_TABLET | Freq: Two times a day (BID) | ORAL | 0 refills | Status: AC

## 2017-02-04 MED ORDER — QUETIAPINE FUMARATE 200 MG PO TABS: 200.0000 mg | ORAL_TABLET | Freq: Every day | ORAL | 0 refills | Status: AC

## 2017-02-04 NOTE — ED Notes (Signed)
Patient brought breakfast. 

## 2017-02-04 NOTE — ED Notes (Signed)
Patient brought extra sandwich tray for c/o still being hungry- pt calm and cooperative- compliant with meds

## 2017-02-04 NOTE — ED Provider Notes (Signed)
-----------------------------------------   7:31 AM on 02/04/2017 -----------------------------------------   Blood pressure (!) 107/47, pulse 71, temperature 97.7 F (36.5 C), temperature source Oral, resp. rate 16, height  (1.651 m), weight 72.6 kg (160 lb), SpO2 96 %.  The patient had no acute events since last update.  Calm and cooperative at this time.  Disposition is pending Psychiatry/Behavioral Medicine team recommendations.     Myrna Blazer, MD 02/04/17 [phone]

## 2017-02-04 NOTE — ED Notes (Signed)
Patient transferred from ED to [facility] in wine colored scrubs. Pt is wanded and oriented to unit. Pt is alert, oriented and currently denies SI/HI and A/V hallucinations. Pt given two additional blankets for c/o being cold. Pt requesting breakfast- reassured pt that breakfast will be here shortly. Pt resting quietly with eyes closed. Pt will remain safe with 15 minute checks.
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
## 2017-02-04 NOTE — ED Notes (Signed)
Pt discharged to lobby to wait on mother for transportation. Pt was stable and appreciative at that time. All papers and prescriptions were given. No belongings except shorts pt is wearing. Verbal understanding expressed. Denies SI/HI and A/VH. Pt given opportunity to express concerns and ask questions.

## 2017-02-04 NOTE — ED Provider Notes (Signed)
Dr. Toni Amend recommends taking the patient off IVC and discharge.  He felt the patient's symptoms were related to amphetamine psychosis.   Merrily Brittle, MD 02/04/17 [phone]

## 2017-02-04 NOTE — ED Notes (Signed)
Pt currently sleeping in bed. Nothing needed from staff at this time
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
## 2019-06-29 ENCOUNTER — Emergency Department: Admission: EM | Admit: 2019-06-29 | Discharge: 2019-06-29 | Discharge status: Against Medical Advice | Payer: Self-pay

## 2021-02-02 ENCOUNTER — Ambulatory Visit
Admission: EM | Admit: 2021-02-02 | Discharge: 2021-02-02 | Disposition: A | Discharge status: Home / Self Care | Payer: Medicaid Other | Attending: Emergency Medicine | Admitting: Emergency Medicine

## 2021-02-02 ENCOUNTER — Other Ambulatory Visit: Payer: Self-pay

## 2021-02-02 DIAGNOSIS — L03115 Cellulitis of right lower limb: Secondary | ICD-10-CM | POA: Diagnosis not present

## 2021-02-02 MED ORDER — DOXYCYCLINE HYCLATE 100 MG PO CAPS: 100.0000 mg | ORAL_CAPSULE | Freq: Two times a day (BID) | ORAL | 0 refills | Status: AC

## 2021-02-02 MED ORDER — ONDANSETRON 4 MG PO TBDP: 4.0000 mg | ORAL_TABLET | Freq: Three times a day (TID) | ORAL | 0 refills | Status: AC | PRN

## 2021-02-02 NOTE — Discharge Instructions (Addendum)
Finish the doxycycline, even if you feel better.  Keep this clean with Hibiclens.  Return here in several days if you are not getting any better so that we can change your antibiotics.  Go to the ER if he gets worse.

## 2021-02-02 NOTE — ED Provider Notes (Signed)
HPI  SUBJECTIVE:  Darin Wood is a 30 y.o. male who presents with 2 days of painful erythema, swelling at his medial right ankle where his ankle monitor is located.  He states that he lanced it with a sterile metal object and obtained clear fluid.  No fevers, body aches.  His tetanus is up-to-date.  Symptoms are worse with palpation.  No alleviating factors.  Past medical history of hepatitis C, untreated.  No history of IVDU, boils, abscesses, MRSA infections.    Past Medical History:  Diagnosis Date   Bipolar 2 disorder (HCC)    Borderline personality disorder (HCC)    Suicide attempt (HCC)     History reviewed. No pertinent surgical history.  Family History  Problem Relation Age of Onset   Diabetes Mother    Pancreatitis Father     Social History   Tobacco Use   Smoking status: Every Day    Packs/day: 1.00    Types: Cigarettes   Smokeless tobacco: Never  Substance Use Topics   Alcohol use: Yes    Comment: regularly    No current facility-administered medications for this encounter.  Current Outpatient Medications:    buPROPion (WELLBUTRIN SR) 150 MG 12 hr tablet, Take 1 tablet (150 mg total) by mouth 3 (three) times daily., Disp: 30 tablet, Rfl: 0   divalproex (DEPAKOTE) 500 MG DR tablet, Take 1 tablet (500 mg total) by mouth every 12 (twelve) hours., Disp: 60 tablet, Rfl: 0   doxycycline (VIBRAMYCIN) 100 MG capsule, Take 1 capsule (100 mg total) by mouth 2 (two) times daily for 7 days., Disp: 14 capsule, Rfl: 0   ondansetron (ZOFRAN ODT) 4 MG disintegrating tablet, Take 1 tablet (4 mg total) by mouth every 8 (eight) hours as needed for nausea or vomiting., Disp: 20 tablet, Rfl: 0   QUEtiapine (SEROQUEL) 200 MG tablet, Take 1 tablet (200 mg total) by mouth at bedtime., Disp: 30 tablet, Rfl: 0   citalopram (CELEXA) 40 MG tablet, Take 1 tablet (40 mg total) by mouth daily. (Patient not taking: No sig reported), Disp: 10 tablet, Rfl: 0  Allergies  Allergen Reactions    Ceclor [Cefaclor] Hives   Sulfa Antibiotics Hives   Toradol [Ketorolac Tromethamine] Nausea And Vomiting   Tramadol    Haldol [Haloperidol Lactate] Anxiety     ROS  As noted in HPI.   Physical Exam  BP (!) 122/110 (BP Location: Left Arm)   Pulse (!) 103   Temp 98.2 F (36.8 C) (Oral)   Resp 18   Ht 5\' 4"  (1.626 m)   Wt 77.1 kg   SpO2 99%   BMI 29.18 kg/m   Constitutional: Well developed, well nourished, no acute distress Eyes:  EOMI, conjunctiva normal bilaterally HENT: Normocephalic, atraumatic,mucus membranes moist Respiratory: Normal inspiratory effort Cardiovascular: Normal rate GI: nondistended skin: 7 x 3 tender area of erythema, edema with no central fluctuance medial right ankle.  No tenderness, erythema of the joint, tenderness superiorly.  Marked area of erythema with a marker for reference.  Positive clear scant expressible discharge.    Musculoskeletal: no deformities Neurologic: Alert & oriented x 3, no focal neuro deficits Psychiatric: Speech and behavior appropriate   ED Course   Medications - No data to display  Orders Placed This Encounter  Procedures   Aerobic Culture w Gram Stain (superficial specimen)    Standing Status:   Standing    Number of Occurrences:   1   Apply dressing    Standing  Status:   Standing    Number of Occurrences:   1    No results found for this or any previous visit (from the past 24 hour(s)). No results found.  ED Clinical Impression  1. Cellulitis of right lower extremity      ED Assessment/Plan  Patient with a cellulitis of the right ankle.  There is nothing to I&D today.  Patient shows me pictures of clear drainage coming out from the area when he tried to drain it.  There is no evidence of joint involvement.  Patient is allergic to cephalosporins, so deferring Rocephin.  States his tetanus is up-to-date.  Will send home with doxycycline for 7 days.  Wound culture sent.  He may take Tylenol ibuprofen as  needed.  Keep clean with Hibiclens, Zofran as he states that he "does not do well" with antibiotics.  will have this wrapped and dressed.  Return to the clinic if no better in 2 to 3 days, to the ER if he gets worse.  Also writing a note to his parole officer to have him change the bracelet to the other ankle.  Discussed MDM, treatment plan, and plan for follow-up with patient. Discussed sn/sx that should prompt return to the ED. patient agrees with plan.   Meds ordered this encounter  Medications   doxycycline (VIBRAMYCIN) 100 MG capsule    Sig: Take 1 capsule (100 mg total) by mouth 2 (two) times daily for 7 days.    Dispense:  14 capsule    Refill:  0   ondansetron (ZOFRAN ODT) 4 MG disintegrating tablet    Sig: Take 1 tablet (4 mg total) by mouth every 8 (eight) hours as needed for nausea or vomiting.    Dispense:  20 tablet    Refill:  0      *This clinic note was created using Scientist, clinical (histocompatibility and immunogenetics). Therefore, there may be occasional mistakes despite careful proofreading.  ?    Domenick Gong, MD 02/02/21 1459

## 2021-02-02 NOTE — ED Triage Notes (Signed)
Pt here with C/O skin infection under ankle monitor on right ankle, for 2 days. Pt states he lanced it himself.. happened on left ankle prior was able to get monitor switched to right ankle.

## 2021-02-05 LAB — AEROBIC CULTURE W GRAM STAIN (SUPERFICIAL SPECIMEN)

## 2021-03-29 ENCOUNTER — Emergency Department
Admission: EM | Admit: 2021-03-29 | Discharge: 2021-03-30 | Disposition: A | Discharge status: Home / Self Care | Payer: No Typology Code available for payment source | Attending: Emergency Medicine | Admitting: Emergency Medicine

## 2021-03-29 ENCOUNTER — Other Ambulatory Visit: Payer: Self-pay

## 2021-03-29 ENCOUNTER — Encounter: Payer: Self-pay | Admitting: Emergency Medicine

## 2021-03-29 DIAGNOSIS — T6594XA Toxic effect of unspecified substance, undetermined, initial encounter: Secondary | ICD-10-CM

## 2021-03-29 DIAGNOSIS — F1994 Other psychoactive substance use, unspecified with psychoactive substance-induced mood disorder: Secondary | ICD-10-CM | POA: Diagnosis present

## 2021-03-29 DIAGNOSIS — Z79899 Other long term (current) drug therapy: Secondary | ICD-10-CM | POA: Diagnosis not present

## 2021-03-29 DIAGNOSIS — R45851 Suicidal ideations: Secondary | ICD-10-CM | POA: Diagnosis not present

## 2021-03-29 DIAGNOSIS — Y900 Blood alcohol level of less than 20 mg/100 ml: Secondary | ICD-10-CM | POA: Insufficient documentation

## 2021-03-29 DIAGNOSIS — F17210 Nicotine dependence, cigarettes, uncomplicated: Secondary | ICD-10-CM | POA: Insufficient documentation

## 2021-03-29 DIAGNOSIS — F1914 Other psychoactive substance abuse with psychoactive substance-induced mood disorder: Secondary | ICD-10-CM | POA: Diagnosis present

## 2021-03-29 DIAGNOSIS — F1910 Other psychoactive substance abuse, uncomplicated: Secondary | ICD-10-CM

## 2021-03-29 LAB — COMPREHENSIVE METABOLIC PANEL
ALT: 268 U/L — ABNORMAL HIGH (ref 0–44)
AST: 94 U/L — ABNORMAL HIGH (ref 15–41)
Albumin: 4.4 g/dL (ref 3.5–5.0)
Alkaline Phosphatase: 92 U/L (ref 38–126)
Anion gap: 8 (ref 5–15)
BUN: 11 mg/dL (ref 6–20)
CO2: 25 mmol/L (ref 22–32)
Calcium: 9.6 mg/dL (ref 8.9–10.3)
Chloride: 103 mmol/L (ref 98–111)
Creatinine, Ser: 0.73 mg/dL (ref 0.61–1.24)
GFR, Estimated: >60 mL/min (ref >60)
Glucose, Bld: 153 mg/dL — ABNORMAL HIGH (ref 70–99)
Potassium: 3.9 mmol/L (ref 3.5–5.1)
Sodium: 136 mmol/L (ref 135–145)
Total Bilirubin: 0.7 mg/dL (ref 0.3–1.2)
Total Protein: 7.8 g/dL (ref 6.5–8.1)

## 2021-03-29 LAB — CBC
HCT: 45.5 % (ref 39.0–52.0)
Hemoglobin: 15.4 g/dL (ref 13.0–17.0)
MCH: 29.5 pg (ref 26.0–34.0)
MCHC: 33.8 g/dL (ref 30.0–36.0)
MCV: 87.2 fL (ref 80.0–100.0)
Platelets: 300 10*3/uL (ref 150–400)
RBC: 5.22 MIL/uL (ref 4.22–5.81)
RDW: 13.4 % (ref 11.5–15.5)
WBC: 11 10*3/uL — ABNORMAL HIGH (ref 4.0–10.5)
nRBC: 0 % (ref 0.0–0.2)

## 2021-03-29 LAB — ETHANOL: Alcohol, Ethyl (B): <10 mg/dL (ref <10)

## 2021-03-29 LAB — ACETAMINOPHEN LEVEL: Acetaminophen (Tylenol), Serum: <10 ug/mL — ABNORMAL LOW (ref 10–30)

## 2021-03-29 MED ORDER — SODIUM CHLORIDE 0.9 % IV BOLUS
1000.0000 mL | Freq: Once | INTRAVENOUS | Status: AC
  Administered 2021-03-29: 1000 mL via INTRAVENOUS

## 2021-03-29 MED ORDER — PEG 3350-KCL-NA BICARB-NACL 420 G PO SOLR
2000.0000 mL | Freq: Once | ORAL | Status: DC
  Filled 2021-03-29: qty 4000
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
## 2021-03-29 NOTE — ED Notes (Addendum)
Continue to watch for CNS depression and give narcan if needed. If QTC still above 500 need to supplement and bring up potassium and mag levels. Still remains 24hr OBS from time of arrival. MD made aware.

## 2021-03-29 NOTE — ED Notes (Signed)
Personal Belongings:  Designer, industrial/product Grey pants Yellow ring

## 2021-03-29 NOTE — ED Notes (Signed)
Pt given phone 

## 2021-03-29 NOTE — ED Notes (Addendum)
Poison control contacted and spoke with Kathlene November. IV fluids and benzodiazepines for meth symptoms. Watch for CNS. Recommends dose of activated charcoal and 1-2L of Golytely. Large ingestion = 24hr obs and does not have to pass fentanyl bag. Also advises rectal exam before discharge.
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
## 2021-03-29 NOTE — ED Notes (Signed)
Pt refusing to remove pants and fully dress out into behavioral clothes.  Pt wheeled back to exam room still in police handcuffs.

## 2021-03-29 NOTE — ED Notes (Addendum)
In the police ride here pt started banging his head on the plexy glass barricade in the police care. Pt has head laceration noted. Bleeding controlled now. Pt has wine colored scrub top on but remains in personal pants and shoes. Pt endorses SI stating he has been taking extra of his psych meds at home. Pt stating he also swallowed a fentanyl balloon and did meth prior to getting arrested.

## 2021-03-29 NOTE — ED Notes (Signed)
Police officer stating pt is no longer under arrest but has a warrant out. Pt remains IVC at this time and under a 24hr observation. Emergency planning/management officer gave this RN pt's wallet, ID and ring that was placed in pt's belonging bag.

## 2021-03-29 NOTE — ED Notes (Signed)
IVC, medical observation at this time

## 2021-03-29 NOTE — ED Provider Notes (Signed)
[facility] Emergency Department Provider Note  Time seen: 1:59 PM  I have reviewed the triage vital signs and the nursing notes.   HISTORY  Chief Complaint Ingestion   HPI Darin Wood is a 30 y.o. male with a past medical history of bipolar, substance abuse, schizophrenia, presents to the emergency department with police for possible ingestion.  According to the patient has parole officer was checking on him and when they knocked on the door he ingested a small bag of powder that was sealed that he believes was fentanyl as well as methamphetamine.  States he did not want to get caught with the substances so he swallowed them.  Patient is awake and alert, somewhat somnolent at times will closes eyes, pupils appear equal and reactive not pinpoint.  Patient states he is depressed because he is having issues with his wife and is having thoughts of killing himself.   Past Medical History:  Diagnosis Date   Bipolar 2 disorder (HCC)    Borderline personality disorder (HCC)    Suicide attempt [facility])     Patient Active Problem List   Diagnosis Date Noted   Bipolar affective (HCC) 02/04/2017   Amphetamine and psychostimulant-induced psychosis with hallucinations (HCC) 02/04/2017   Substance induced mood disorder (HCC) 02/04/2017   Amphetamine abuse (HCC) 02/04/2017   Marijuana abuse 02/04/2017   Noncompliance 02/04/2017   Schizophrenia (HCC) 02/01/2017    History reviewed. No pertinent surgical history.  Prior to Admission medications   Medication Sig Start Date End Date Taking? Authorizing Provider  buPROPion (WELLBUTRIN SR) 150 MG 12 hr tablet Take 1 tablet (150 mg total) by mouth 3 (three) times daily. 12/17/14   Payton Mccallum, MD  citalopram (CELEXA) 40 MG tablet Take 1 tablet (40 mg total) by mouth daily. Patient not taking: No sig reported 12/17/14   Payton Mccallum, MD  divalproex (DEPAKOTE) 500 MG DR tablet Take 1 tablet (500 mg total) by mouth every 12  (twelve) hours. 02/04/17   Clapacs, Jackquline Denmark, MD  ondansetron (ZOFRAN ODT) 4 MG disintegrating tablet Take 1 tablet (4 mg total) by mouth every 8 (eight) hours as needed for nausea or vomiting. 02/02/21   Domenick Gong, MD  QUEtiapine (SEROQUEL) 200 MG tablet Take 1 tablet (200 mg total) by mouth at bedtime. 02/04/17   Clapacs, Jackquline Denmark, MD    Allergies  Allergen Reactions   Ceclor [Cefaclor] Hives   Sulfa Antibiotics Hives   Toradol [Ketorolac Tromethamine] Nausea And Vomiting   Tramadol    Haldol [Haloperidol Lactate] Anxiety    Family History  Problem Relation Age of Onset   Diabetes Mother    Pancreatitis Father     Social History Social History   Tobacco Use   Smoking status: Every Day    Packs/day: 1.00    Types: Cigarettes   Smokeless tobacco: Never  Substance Use Topics   Alcohol use: Yes    Comment: regularly    Review of Systems Constitutional: Negative for fever. Cardiovascular: Negative for chest pain. Respiratory: Negative for shortness of breath. Gastrointestinal: Negative for abdominal pain, vomiting  Musculoskeletal: Negative for musculoskeletal complaints Neurological: Negative for headache All other ROS negative  ____________________________________________   PHYSICAL EXAM:  VITAL SIGNS: ED Triage Vitals  Enc Vitals Group     BP 03/29/21 1313 124/86     Pulse Rate 03/29/21 1313 (!) 135     Resp 03/29/21 1313 20     Temp 03/29/21 1313 98.2 F (36.8 C)  Temp Source 03/29/21 1313 Oral     SpO2 03/29/21 1313 100 %     Weight 03/29/21 1252 169 lb 15.6 oz (77.1 kg)     Height 03/29/21 1252 5\' 4"  (1.626 m)     Head Circumference --      Peak Flow --      Pain Score 03/29/21 1252 7     Pain Loc --      Pain Edu? --      Excl. in GC? --     Constitutional: Patient is awake and alert, occasionally closes eyes but awakens easily to voice and will answer questions.  No acute distress. Eyes: Normal exam ENT      Head: Normocephalic and  atraumatic.      Mouth/Throat: Mucous membranes are moist. Cardiovascular: Regular rhythm rate around 120 bpm.  No obvious murmur. Respiratory: Normal respiratory effort without tachypnea nor retractions. Breath sounds are clear  Gastrointestinal: Soft and nontender. No distention.   Musculoskeletal: Nontender with normal range of motion in all extremities.  Neurologic:  Normal speech and language. No gross focal neurologic deficits  Skin:  Skin is warm, dry and intact.  Psychiatric: States depression and suicidal ideation  ____________________________________________    EKG  EKG viewed and interpreted by myself shows sinus tachycardia 146 bpm with a narrow QRS, normal axis, normal intervals, nonspecific ST changes.  ____________________________________________   INITIAL IMPRESSION / ASSESSMENT AND PLAN / ED COURSE  Pertinent labs & imaging results that were available during my care of the patient were reviewed by me and considered in my medical decision making (see chart for details).   Patient presents to the emergency department for suicidal ideation and possible ingestion.  Patient claims to have ingested a baggy full of powder that he believes was fentanyl but was sealed approximately 1 hour prior to ER presentation.  Also states he took methamphetamines, states he is depressed and having thoughts of killing himself.  Patient currently appears well, keeps his eyes closed most of the exam but answers questions appropriately, does not appear overly somnolent.  Vital signs are reassuring including 100% room air saturation.  We will keep the patient monitored on cardiac monitoring with continuous pulse oximetry.  We will discussed with poison control.  We will place the patient under an IVC and have psychiatry evaluate.  Given the possible ingestion of a baggy of sealed fentanyl this complicates the emergent work-up and may require prolonged monitoring.  Currently calm and  cooperative.  Per poison control we will dose 2 L of GoLytely, dose a liter of IV fluids and continue to closely monitor on cardiac monitoring for a full 24 hours.  We will hold off on charcoal administration as the patient is already been in the emergency department over 2 hours and took the possible ingestion at least 1 hour prior to that.  Patient under IVC.  Darin Wood was evaluated in Emergency Department on 03/29/2021 for the symptoms described in the history of present illness. He was evaluated in the context of the global COVID-19 pandemic, which necessitated consideration that the patient might be at risk for infection with the SARS-CoV-2 virus that causes COVID-19. Institutional protocols and algorithms that pertain to the evaluation of patients at risk for COVID-19 are in a state of rapid change based on information released by regulatory bodies including the CDC and federal and state organizations. These policies and algorithms were followed during the patient's care in the ED.  ____________________________________________  FINAL CLINICAL IMPRESSION(S) / ED DIAGNOSES  Suicidal ideation Possible ingestion   Harvest Dark, MD 03/29/21 1453

## 2021-03-29 NOTE — Consult Note (Signed)
[facility] Face-to-Face Psychiatry Consult   Reason for Consult:  voices SI after arrest Referring Physician:  Paduchowski Patient Identification: Darin Wood MRN:  [id number] Principal Diagnosis: <principal problem not specified> Diagnosis:  Active Problems:   * No active hospital problems. *   Total Time spent with patient: 45 minutes  Subjective:   Darin Wood is a 30 y.o. male patient admitted with "It's been pretty rough".    HPI: Patient seen and chart reviewed. Patient has Emergency planning/management officer at bedside.  Officer told Clinical research associate that patient is under arrest. He is on handcuffs. Per report, probation officer had come to patient's residence.  Patient swallowing what he believed to be a baggy of fentanyl because he was concerned about being arrested.  After being under arrest patient started endorsing suicidal thoughts.  On evaluation, patient states that his wife left him a few days ago and he has been taking his Seroquel and Wellbutrin, more than necessary but does not know how much.  He also states that his wife left drugs in the house with paraphernalia, and he was nervous about the police finding it there so he swallowed a bunch of "stuff."  Patient states that he also had suicidal thoughts the last several days and he also used meth in the last few days.  He denies auditory or visual hallucinations.  Denies alcohol use.  He states that he has a "sense of dread for 1 month."  Patient is going to be medically observed for 24 hours from presentation to the ED due to his ingestion of substances.  We can reassess for suicidal thoughts when medically clear and if he is not under arrest.   HPI:Per Dr. Lenard Lance:  "Darin Wood is a 30 y.o. male with a past medical history of bipolar, substance abuse, schizophrenia, presents to the emergency department with police for possible ingestion.  According to the patient has parole officer was checking on him and when they knocked on the door he ingested a  small bag of powder that was sealed that he believes was fentanyl as well as methamphetamine.  States he did not want to get caught with the substances so he swallowed them.  Patient is awake and alert, somewhat somnolent at times will closes eyes, pupils appear equal and reactive not pinpoint.  Patient states he is depressed because he is having issues with his wife and is having thoughts of killing himself."        Past Psychiatric History: Patient states he goes to [facility] in [place].  Substance use.  Bipolar disorder  Risk to Self:   Risk to Others:   Prior Inpatient Therapy:   Prior Outpatient Therapy:    Past Medical History:  Past Medical History:  Diagnosis Date   Bipolar 2 disorder (HCC)    Borderline personality disorder (HCC)    Suicide attempt (HCC)    History reviewed. No pertinent surgical history. Family History:  Family History  Problem Relation Age of Onset   Diabetes Mother    Pancreatitis Father    Family Psychiatric  History: None known Social History:  Social History   Substance and Sexual Activity  Alcohol Use Yes   Comment: regularly     Social History   Substance and Sexual Activity  Drug Use Not on file    Social History   Socioeconomic History   Marital status: Single    Spouse name: Not on file   Number of children: Not on file  Years of education: Not on file   Highest education level: Not on file  Occupational History   Not on file  Tobacco Use   Smoking status: Every Day    Packs/day: 1.00    Types: Cigarettes   Smokeless tobacco: Never  Vaping Use   Vaping Use: Not on file  Substance and Sexual Activity   Alcohol use: Yes    Comment: regularly   Drug use: Not on file   Sexual activity: Not on file  Other Topics Concern   Not on file  Social History Narrative   Not on file   Social Determinants of Health   Financial Resource Strain: Not on file  Food Insecurity: Not on file  Transportation Needs: Not on  file  Physical Activity: Not on file  Stress: Not on file  Social Connections: Not on file   Additional Social History:    Allergies:   Allergies  Allergen Reactions   Ceclor [Cefaclor] Hives   Sulfa Antibiotics Hives   Toradol [Ketorolac Tromethamine] Nausea And Vomiting   Tramadol    Haldol [Haloperidol Lactate] Anxiety    Labs:  Results for orders placed or performed during the hospital encounter of 03/29/21 (from the past 48 hour(s))  Comprehensive metabolic panel     Status: Abnormal   Collection Time: 03/29/21 12:54 PM  Result Value Ref Range   Sodium 136 135 - 145 mmol/L   Potassium 3.9 3.5 - 5.1 mmol/L    Comment: HEMOLYSIS AT THIS LEVEL MAY AFFECT RESULT   Chloride 103 98 - 111 mmol/L   CO2 25 22 - 32 mmol/L   Glucose, Bld 153 (H) 70 - 99 mg/dL    Comment: Glucose reference range applies only to samples taken after fasting for at least 8 hours.   BUN 11 6 - 20 mg/dL   Creatinine, Ser 3.29 0.61 - 1.24 mg/dL   Calcium 9.6 8.9 - 51.8 mg/dL   Total Protein 7.8 6.5 - 8.1 g/dL   Albumin 4.4 3.5 - 5.0 g/dL   AST 94 (H) 15 - 41 U/L   ALT 268 (H) 0 - 44 U/L   Alkaline Phosphatase 92 38 - 126 U/L   Total Bilirubin 0.7 0.3 - 1.2 mg/dL   GFR, Estimated >84 >16 mL/min    Comment: (NOTE) Calculated using the CKD-EPI Creatinine Equation (2021)    Anion gap 8 5 - 15    Comment: Performed at [facility], [address] [place], [place] [address]  Ethanol     Status: None   Collection Time: 03/29/21 12:54 PM  Result Value Ref Range   Alcohol, Ethyl (B) <10 <10 mg/dL    Comment: (NOTE) Lowest detectable limit for serum alcohol is 10 mg/dL.  For medical purposes only. Performed at [facility], [address], [place], [place] [address]   Acetaminophen level     Status: Abnormal   Collection Time: 03/29/21 12:54 PM  Result Value Ref Range   Acetaminophen (Tylenol), Serum <10 (L) 10 - 30 ug/mL    Comment: (NOTE) Therapeutic concentrations vary  significantly. A range of 10-30 ug/mL  may be an effective concentration for many patients. However, some  are best treated at concentrations outside of this range. Acetaminophen concentrations >150 ug/mL at 4 hours after ingestion  and >50 ug/mL at 12 hours after ingestion are often associated with  toxic reactions.  Performed at [facility], [address] [place], [place] [address]   cbc  Status: Abnormal   Collection Time: 03/29/21 12:54 PM  Result Value Ref Range   WBC 11.0 (H) 4.0 - 10.5 K/uL   RBC 5.22 4.22 - 5.81 MIL/uL   Hemoglobin 15.4 13.0 - 17.0 g/dL   HCT 40.9 81.1 - 91.4 %   MCV 87.2 80.0 - 100.0 fL   MCH 29.5 26.0 - 34.0 pg   MCHC 33.8 30.0 - 36.0 g/dL   RDW 78.2 95.6 - 21.3 %   Platelets 300 150 - 400 K/uL   nRBC 0.0 0.0 - 0.2 %    Comment: Performed at [facility], [address], [place], [place] [address]    Current Facility-Administered Medications  Medication Dose Route Frequency Provider Last Rate Last Admin   polyethylene glycol-electrolytes (NuLYTELY) solution 2,000 mL  2,000 mL Oral Once Minna Antis, MD       Current Outpatient Medications  Medication Sig Dispense Refill   buPROPion (WELLBUTRIN SR) 150 MG 12 hr tablet Take 1 tablet (150 mg total) by mouth 3 (three) times daily. 30 tablet 0   citalopram (CELEXA) 40 MG tablet Take 1 tablet (40 mg total) by mouth daily. (Patient not taking: No sig reported) 10 tablet 0   divalproex (DEPAKOTE) 500 MG DR tablet Take 1 tablet (500 mg total) by mouth every 12 (twelve) hours. 60 tablet 0   ondansetron (ZOFRAN ODT) 4 MG disintegrating tablet Take 1 tablet (4 mg total) by mouth every 8 (eight) hours as needed for nausea or vomiting. 20 tablet 0   QUEtiapine (SEROQUEL) 200 MG tablet Take 1 tablet (200 mg total) by mouth at bedtime. 30 tablet 0    Musculoskeletal: Strength & Muscle Tone: within normal limits Gait & Station:  not observed Patient leans:  N/A            Psychiatric Specialty Exam:  Presentation  General Appearance: Appropriate for Environment Eye Contact:Good Speech:Clear and Coherent Speech Volume:Normal Handedness:No data recorded  Mood and Affect  Mood:Anxious; Depressed Affect:Blunt  Thought Process  Thought Processes:Coherent Descriptions of Associations:Intact Orientation:Full (Time, Place and Person) Thought Content:WDL History of Schizophrenia/Schizoaffective disorder:No data recorded Duration of Psychotic Symptoms:No data recorded Hallucinations:Hallucinations: None Ideas of Reference:None Suicidal Thoughts:Suicidal Thoughts: Yes, Passive Homicidal Thoughts:Homicidal Thoughts: No  Sensorium  Memory:Immediate Good Judgment:Poor Insight:Poor  Executive Functions  Concentration:Fair Attention Span:Fair Recall:Fair Fund of Knowledge:Fair Language:Fair  Psychomotor Activity  Psychomotor Activity:Psychomotor Activity: Normal  Assets  Assets:Resilience  Sleep  Sleep:Sleep: Fair  Physical Exam: Physical Exam Vitals and nursing note reviewed.  HENT:     Nose: No congestion or rhinorrhea.  Eyes:     General:        Right eye: No discharge.        Left eye: No discharge.  Pulmonary:     Effort: Pulmonary effort is normal.  Musculoskeletal:        General: Normal range of motion.     Cervical back: Normal range of motion.  Skin:    General: Skin is dry.  Neurological:     Mental Status: He is oriented to person, place, and time.   Review of Systems  Reason unable to perform ROS: under medical care.  Psychiatric/Behavioral:  Positive for depression.   Blood pressure 124/86, pulse (!) 135, temperature 98.2 F (36.8 C), temperature source Oral, resp. rate 20, height 5\' 4"  (1.626 m), weight 77.1 kg, SpO2 100 %. Body mass index is 29.18 kg/m.  Treatment Plan Summary: Plan reevaluate tomorrow when medically clear  Disposition:  Patient is  being medically held in ED.Marland Kitchen   Evaluate when medically clear  Vanetta Mulders, NP 03/29/2021 7:04 PM

## 2021-03-30 LAB — SALICYLATE LEVEL: Salicylate Lvl: <7 mg/dL — ABNORMAL LOW (ref 7.0–30.0)

## 2021-03-30 NOTE — ED Provider Notes (Signed)
Emergency Medicine Observation Re-evaluation Note  Darin Wood is a 30 y.o. male, seen on rounds today.  Pt initially presented to the ED for complaints of Ingestion Currently, the patient is resting comfortably without complaints   .  Physical Exam  BP 123/84   Pulse 86   Temp 98.2 F (36.8 C) (Oral)   Resp 17   Ht 5\' 4"  (1.626 m)   Wt 77.1 kg   SpO2 100%   BMI 29.18 kg/m  Physical Exam Gen: No acute distress  Resp: Normal rise and fall of chest Neuro: Moving all four extremities Psych: Resting currently, calm and cooperative when awake  ED Course / MDM  EKG:EKG Interpretation  Date/Time:  Thursday March 30 2021 00:52:53 EST Ventricular Rate:  89 PR Interval:  142 QRS Duration: 96 QT Interval:  358 QTC Calculation: 435 R Axis:   45 Text Interpretation: Normal sinus rhythm Normal ECG Confirmed by 12-13-1998 [id number]) on 03/30/2021 12:58:46 AM  I have reviewed the labs performed to date as well as medications administered while in observation.  Recent changes in the last 24 hours include no acute events overnight.  Plan  Current plan is for 24-hour medical observation and then medically cleared for psychiatric evaluation and further disposition. ALIF PETRAK is under involuntary commitment.      Jannae Fagerstrom, Wenda Overland, DO 03/30/21 [phone]

## 2021-03-30 NOTE — ED Notes (Signed)
Encouraged patient to tidy room, provided trash can for patient to throw away any trash in patient room with staff supervision.  

## 2021-03-30 NOTE — ED Notes (Addendum)
Aunt picking up patient. Verified correct patient and correct discharge papers given. Pt alert and oriented X 4, stable for discharge. RR even and unlabored, color WNL. Discussed discharge instructions and follow-up as directed. Discharge medications discussed, when prescribed. Pt had opportunity to ask questions, and RN available to provide patient and/or family education. Left with ALL of belongings. Denies complaints. Ambulated patient to lobby

## 2021-03-30 NOTE — ED Provider Notes (Signed)
-----------------------------------------   12:06 PM on 03/30/2021 ----------------------------------------- Patient confirms that he was not suicidal.  He was just trying to hide the evidence.  He says he passed a bag of fentanyl.  Its been over 24 hours since his ingestion and he is therefore medically cleared per poison control.  He has an appointment with jail.  I will let him go to keep that appointment as he asks.   Arnaldo Natal, MD 03/30/21 [phone]

## 2021-03-30 NOTE — ED Notes (Signed)
Pt ambulated to bathroom to preform ADL's no assistance required 

## 2021-03-30 NOTE — ED Notes (Signed)
Given phone to call mother. Pt on phone with her now.

## 2021-03-30 NOTE — Consult Note (Addendum)
[facility] Face-to-Face Psychiatry Consult   Reason for Consult:  follow up Referring Physician:  EDP Patient Identification: Darin Wood MRN:  [id number] Principal Diagnosis: Substance induced mood disorder (HCC) Diagnosis:  Principal Problem:   Substance induced mood disorder (HCC)   Total Time spent with patient: 20 minutes  Subjective:  "I feel a lot better" Darin Wood is a 30 y.o. male patient admitted with suicidal ideation ion context of substance use.  HPI:  See previous. Patient states that he is going to live with his mother. He is no longer under arrest (police released him on bond yesterday). He is medically cleared and states he will go straight to [place] [employer] to post bail for bond. He is calm, cooperative, Reports no suicidal or homicidal ideation. Denies auditory or visual hallucinations or paranoia. Does not appear to be responding to internal stimuli.   Past Psychiatric History: see previous  Risk to Self:   Risk to Others:   Prior Inpatient Therapy:   Prior Outpatient Therapy:    Past Medical History:  Past Medical History:  Diagnosis Date   Bipolar 2 disorder (HCC)    Borderline personality disorder (HCC)    Suicide attempt (HCC)    History reviewed. No pertinent surgical history. Family History:  Family History  Problem Relation Age of Onset   Diabetes Mother    Pancreatitis Father    Family Psychiatric  History: see previous Social History:  Social History   Substance and Sexual Activity  Alcohol Use Yes   Comment: regularly     Social History   Substance and Sexual Activity  Drug Use Not on file    Social History   Socioeconomic History   Marital status: Single    Spouse name: Not on file   Number of children: Not on file   Years of education: Not on file   Highest education level: Not on file  Occupational History   Not on file  Tobacco Use   Smoking status: Every Day    Packs/day: 1.00    Types: Cigarettes   Smokeless tobacco: Never   Vaping Use   Vaping Use: Not on file  Substance and Sexual Activity   Alcohol use: Yes    Comment: regularly   Drug use: Not on file   Sexual activity: Not on file  Other Topics Concern   Not on file  Social History Narrative   Not on file   Social Determinants of Health   Financial Resource Strain: Not on file  Food Insecurity: Not on file  Transportation Needs: Not on file  Physical Activity: Not on file  Stress: Not on file  Social Connections: Not on file   Additional Social History:    Allergies:   Allergies  Allergen Reactions   Ceclor [Cefaclor] Hives   Sulfa Antibiotics Hives   Toradol [Ketorolac Tromethamine] Nausea And Vomiting   Tramadol    Haldol [Haloperidol Lactate] Anxiety    Labs:  Results for orders placed or performed during the hospital encounter of 03/29/21 (from the past 48 hour(s))  Comprehensive metabolic panel     Status: Abnormal   Collection Time: 03/29/21 12:54 PM  Result Value Ref Range   Sodium 136 135 - 145 mmol/L   Potassium 3.9 3.5 - 5.1 mmol/L    Comment: HEMOLYSIS AT THIS LEVEL MAY AFFECT RESULT   Chloride 103 98 - 111 mmol/L   CO2 25 22 - 32 mmol/L   Glucose, Bld 153 (H) 70 - 99  mg/dL    Comment: Glucose reference range applies only to samples taken after fasting for at least 8 hours.   BUN 11 6 - 20 mg/dL   Creatinine, Ser 0.08 0.61 - 1.24 mg/dL   Calcium 9.6 8.9 - 67.6 mg/dL   Total Protein 7.8 6.5 - 8.1 g/dL   Albumin 4.4 3.5 - 5.0 g/dL   AST 94 (H) 15 - 41 U/L   ALT 268 (H) 0 - 44 U/L   Alkaline Phosphatase 92 38 - 126 U/L   Total Bilirubin 0.7 0.3 - 1.2 mg/dL   GFR, Estimated >19 >50 mL/min    Comment: (NOTE) Calculated using the CKD-EPI Creatinine Equation (2021)    Anion gap 8 5 - 15    Comment: Performed at [facility], [address] [place], [place] [address]  Ethanol     Status: None   Collection Time: 03/29/21 12:54 PM  Result Value Ref Range   Alcohol, Ethyl (B) <10 <10 mg/dL    Comment:  (NOTE) Lowest detectable limit for serum alcohol is 10 mg/dL.  For medical purposes only. Performed at [facility], [address], [place], [place] [address]   Acetaminophen level     Status: Abnormal   Collection Time: 03/29/21 12:54 PM  Result Value Ref Range   Acetaminophen (Tylenol), Serum <10 (L) 10 - 30 ug/mL    Comment: (NOTE) Therapeutic concentrations vary significantly. A range of 10-30 ug/mL  may be an effective concentration for many patients. However, some  are best treated at concentrations outside of this range. Acetaminophen concentrations >150 ug/mL at 4 hours after ingestion  and >50 ug/mL at 12 hours after ingestion are often associated with  toxic reactions.  Performed at [facility], [address] [place], [place] [address]   cbc     Status: Abnormal   Collection Time: 03/29/21 12:54 PM  Result Value Ref Range   WBC 11.0 (H) 4.0 - 10.5 K/uL   RBC 5.22 4.22 - 5.81 MIL/uL   Hemoglobin 15.4 13.0 - 17.0 g/dL   HCT 38.2 50.5 - 39.7 %   MCV 87.2 80.0 - 100.0 fL   MCH 29.5 26.0 - 34.0 pg   MCHC 33.8 30.0 - 36.0 g/dL   RDW 67.3 41.9 - 37.9 %   Platelets 300 150 - 400 K/uL   nRBC 0.0 0.0 - 0.2 %    Comment: Performed at [facility], [address] [place], [place] [address]  Salicylate level     Status: Abnormal   Collection Time: 03/30/21 12:20 AM  Result Value Ref Range   Salicylate Lvl <7.0 (L) 7.0 - 30.0 mg/dL    Comment: Performed at [facility], [address] [place], [place] [address]    No current facility-administered medications for this encounter.   Current Outpatient Medications  Medication Sig Dispense Refill   buPROPion (WELLBUTRIN SR) 150 MG 12 hr tablet Take 1 tablet (150 mg total) by mouth 3 (three) times daily. 30 tablet 0   citalopram (CELEXA) 40 MG tablet Take 1 tablet (40 mg total) by mouth daily. (Patient not taking: No sig reported) 10 tablet 0   divalproex (DEPAKOTE) 500 MG DR tablet  Take 1 tablet (500 mg total) by mouth every 12 (twelve) hours. 60 tablet 0   ondansetron (ZOFRAN ODT) 4 MG disintegrating tablet Take 1 tablet (4 mg total) by mouth every 8 (eight) hours as needed for nausea or vomiting. 20 tablet 0   QUEtiapine (  SEROQUEL) 200 MG tablet Take 1 tablet (200 mg total) by mouth at bedtime. 30 tablet 0    Musculoskeletal: Strength & Muscle Tone: within normal limits Gait & Station: normal Patient leans: N/A   Psychiatric Specialty Exam:  Presentation  General Appearance: Appropriate for Environment  Eye Contact:Good  Speech:Clear and Coherent  Speech Volume:Normal  Handedness:No data recorded  Mood and Affect  Mood:Euthymic  Affect:Appropriate   Thought Process  Thought Processes:Coherent  Descriptions of Associations:Intact  Orientation:Full (Time, Place and Person)  Thought Content:Logical  History of Schizophrenia/Schizoaffective disorder:No data recorded Duration of Psychotic Symptoms:No data recorded Hallucinations:Hallucinations: None  Ideas of Reference:None  Suicidal Thoughts:Suicidal Thoughts: No  Homicidal Thoughts:Homicidal Thoughts: No   Sensorium  Memory:Immediate Good  Judgment:Fair  Insight:Fair   Executive Functions  Concentration:Good  Attention Span:Good  Recall:Good  Fund of Knowledge:Good  Language:Good   Psychomotor Activity  Psychomotor Activity:Psychomotor Activity: Normal   Assets  Assets:Housing; Resilience; Physical Health; Social Support   Sleep  Sleep:Sleep: Good   Physical Exam: Physical Exam Vitals and nursing note reviewed.  HENT:     Head: Normocephalic.     Nose: No congestion or rhinorrhea.  Eyes:     General:        Right eye: No discharge.        Left eye: No discharge.  Cardiovascular:     Rate and Rhythm: Normal rate.  Pulmonary:     Effort: Pulmonary effort is normal.  Musculoskeletal:        General: Normal range of motion.     Cervical back: Normal  range of motion.  Skin:    General: Skin is dry.  Neurological:     Mental Status: He is alert and oriented to person, place, and time.  Psychiatric:        Attention and Perception: Attention normal.        Mood and Affect: Mood normal.        Speech: Speech normal.        Behavior: Behavior normal.        Thought Content: Thought content normal.        Cognition and Memory: Cognition normal.        Judgment: Judgment normal.   Review of Systems  Psychiatric/Behavioral:  Positive for substance abuse. Negative for depression, hallucinations, memory loss and suicidal ideas. The patient is not nervous/anxious and does not have insomnia.   All other systems reviewed and are negative. Blood pressure 117/84, pulse 82, temperature 98.5 F (36.9 C), temperature source Oral, resp. rate 16, height 5\' 4"  (1.626 m), weight 77.1 kg, SpO2 99 %. Body mass index is 29.18 kg/m.  Treatment Plan Summary: Plan Patient will continue with outpatient treatment at [facility], where he receives treatment . Reviewed with EDP  Disposition: No evidence of imminent risk to self or others at present.   Patient does not meet criteria for psychiatric inpatient admission. Discussed crisis plan, support from social network, calling 911, coming to the Emergency Department, and calling Suicide Hotline.  [facility], NP 03/30/2021 6:45 PM

## 2021-03-30 NOTE — ED Notes (Signed)
Hospital meal provided.  100% consumed, pt tolerated w/o complaints.  Waste discarded appropriately.   

## 2021-03-30 NOTE — Discharge Instructions (Addendum)
Please be very careful with the fentanyl.  As you know it is very easy to overdose on this.  Please return for any further problems or if you have thoughts of hurting yourself or anything else.

## 2021-03-30 NOTE — ED Notes (Signed)
Patient provided snack at appropriate snack time.  Pt consumed 100% of snack provided, tolerated well w/o complaints   Trash disposted of appropriately by patient.  

## 2021-09-11 ENCOUNTER — Other Ambulatory Visit: Payer: Self-pay

## 2021-09-11 ENCOUNTER — Emergency Department
Admission: EM | Admit: 2021-09-11 | Discharge: 2021-09-11 | Discharge status: Against Medical Advice | Payer: Medicaid Other | Attending: Emergency Medicine | Admitting: Emergency Medicine

## 2021-09-11 ENCOUNTER — Encounter: Payer: Self-pay | Admitting: Emergency Medicine

## 2021-09-11 DIAGNOSIS — S0511XA Contusion of eyeball and orbital tissues, right eye, initial encounter: Secondary | ICD-10-CM | POA: Diagnosis not present

## 2021-09-11 DIAGNOSIS — S0990XA Unspecified injury of head, initial encounter: Secondary | ICD-10-CM | POA: Diagnosis present

## 2021-09-11 DIAGNOSIS — Z5329 Procedure and treatment not carried out because of patient's decision for other reasons: Secondary | ICD-10-CM | POA: Insufficient documentation

## 2021-09-11 DIAGNOSIS — S0102XA Laceration with foreign body of scalp, initial encounter: Secondary | ICD-10-CM | POA: Diagnosis not present
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
## 2021-09-11 NOTE — Discharge Instructions (Signed)
You are signing out AMA which makes you completely responsible for your health care.  Since you have refused a CT scan and any other treatment you cannot hold [place] liable if something happens to you at the jail.  You have been advised to stay and get a CT of your head but you have refused. ?
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
